# Patient Record
Sex: Male | Born: 1960 | Race: White | Hispanic: No | Marital: Married | Smoking: Never smoker
Health system: Southern US, Community
[De-identification: ages and names within clinical notes are randomized; demographics above are authoritative.]

## PROBLEM LIST (undated history)

## (undated) DIAGNOSIS — M549 Dorsalgia, unspecified: Secondary | ICD-10-CM

## (undated) DIAGNOSIS — M542 Cervicalgia: Secondary | ICD-10-CM

## (undated) DIAGNOSIS — I10 Essential (primary) hypertension: Secondary | ICD-10-CM

## (undated) HISTORY — PX: CHOLECYSTECTOMY: SHX55

## (undated) HISTORY — PX: KNEE SURGERY: SHX244

---

## 2006-07-30 ENCOUNTER — Emergency Department (HOSPITAL_COMMUNITY): Admission: EM | Admit: 2006-07-30 | Discharge: 2006-07-30 | Payer: Self-pay | Admitting: Physician Assistant

## 2006-09-08 ENCOUNTER — Ambulatory Visit (HOSPITAL_COMMUNITY): Admission: RE | Admit: 2006-09-08 | Discharge: 2006-09-08 | Payer: Self-pay | Admitting: Surgery

## 2006-09-08 ENCOUNTER — Encounter (INDEPENDENT_AMBULATORY_CARE_PROVIDER_SITE_OTHER): Payer: Self-pay | Admitting: Surgery

## 2010-09-07 NOTE — Op Note (Signed)
Joel Ray, Joel Ray              ACCOUNT NO.:  [id number]   MEDICAL RECORD NO.:  [id number]          PATIENT TYPE:  AMB   LOCATION:  DAY                          FACILITY:  [facility]   PHYSICIAN:  Ardeth Sportsman, MD     DATE OF BIRTH:  [birth year]   DATE OF PROCEDURE:  09/08/2006  DATE OF DISCHARGE:                               OPERATIVE REPORT   PRIMARY CARE PHYSICIAN:  Holley Bouche, M.D.   SURGEON:  Ardeth Sportsman, M.D.   ASSISTANT:  None.   PREOPERATIVE DIAGNOSIS:  Symptomatic cholecystolithiasis.   POSTOPERATIVE DIAGNOSIS:  Symptomatic cholecystolithiasis.   PROCEDURE:  Laparoscopic cholecystectomy with intraoperative  cholangiogram.   ANESTHESIA:  1. General anesthesia.  2. Local anesthetic in a field block around all port sites.   SPECIMENS:  Gallbladder.   DRAINS:  None.   ESTIMATED BLOOD LOSS:  Less than 5 mL.   COMPLICATIONS:  No major complications.   INDICATIONS FOR PROCEDURE:  Mr. Ocallaghan is a pleasant 50 year old male  who has had classic episodes of biliary colic and known gallstones.  The  discussion was made.  The anatomy and physiology of hepatobiliary and  pancreatic function was discussed.  Pathophysiology of  cholecystolithiasis with its risks of gallstone pancreatitis,  cholecystitis, choledocholithiasis, and other natural histories were  discussed.  Options were discussed and recommendations made for  laparoscopic cholecystectomy with intraoperative cholangiogram.   Risks such as stroke, heart attack, deep vein thrombosis, pulmonary  embolism and death were discussed.  Risks such as bleeding, need for  transfusion, wound infection, abscess, injury to other organs were  discussed.  Risks of bile duct injury resulting in the need for  operative reconstruction or internal and external drainage or incisional  hernia and other risks were discussed.  Questions were answered and he  agreed to proceed.   FINDINGS:  He had a very thin-walled  gallbladder with a little bit of  bile and small cholesterol stones.  There is no strong evidence of  cholecystitis at this time.  His cholangiogram appeared to be normal.  The rest of his anatomy appeared to be normal with only some slight  evidence of chronic cholecystitis.   DESCRIPTION OF PROCEDURE:  Informed consent was confirmed.  The patient  voided just prior to going to the operating room.  He had sequential  compression devices active during the entire case.  He underwent general  anesthesia without difficulty.  He was positioned supine with both arms  tucked.  His abdomen was clipped, prepped and draped in a sterile  fashion.   Entry was begun in the abdomen with the patient in steep reversed  Trendelenburg and right side up using 5 mm 0 degree scope and optical  entry.  Capnoperitoneum to 15 mmHg provided good abdominal insufflation.  Camera inspection revealed no intra-abdominal injury.  Under direct  visualization, 5 mm ports were placed into the umbilicus and right  flank, and a 10 mm port was placed through the subxiphoid tunnel to the  falciform ligament.   The gallbladder fundus was grasped and elevated cephalad.  Omental  adhesions were  freed off carefully.  The peritoneal covering between the  gallbladder and the liver were free on the anteromedial and  posterolateral aspects.  The gallbladder was very thin-walled  anteromedially and there was a small nick made in through it and there  was some spillage of bile, but no significant spillage of stones.  Circumferential dissection was done to free the proximal third of the  gallbladder off the liver bed and circumferential dissection revealed  two structures leaving the gallbladder going down to the porta hepatis.  One was small and pulsative consistent with a cystic artery and the  other was leaving structurally from the tip of the neck of the  infundibulum down to the common bile duct consistent with a cystic  duct.  One clip on the gallbladder side and two clips proximally made on the  cyst artery and it was transected.  Two clips on the infundibulum were  made and a partial cysticotomy was performed.   A 5 French cholangiogram catheter was placed through the abdomen through  a right subcostal fat incision, flushed, and placed in the cystic duct.  Cholangiogram was done using diluted radio-opaque contrast using  continuous fluoroscopy.  Contrast flowed well from a side branch into  the main consistent with cystic duct cannulization.  Contrast flowed  well into the right and left intrahepatic chains to the common bile duct  and down into the duodenum because this was a normal cholangiogram with  no evidence of any ampullary stricture or abnormalities.  Cholangiocatheter was removed.   Cystic duct had four clips placed on it.  This was a long narrow cystic  duct.  The cystic duct transection was completed.  The gallbladder was  freed from its remaining attachments and placed inside an EndoCatch bag  and removed out the subxiphoid port after some mild dilation was needed.  The fascial defect at the subxiphoid area was approximated using a 0  Vicryl stitch using laparoscopic suture passer in a figure-of-eight  fashion to good result.   Copious irrigation about a gallon of crystalloid was done until there  was nice clear result with no evidence of any significant bile.  Hemostasis was excellent on the liver bed and clips were nice intact.  The other upper two abdominal ports were removed with no evidence of  leak of blood on the peritoneal or skin side.  Capnoperitoneum was  completely evacuated.  Skin was closed using 4-0 Monocryl stitch.  Sterile dressings were applied.   The patient was extubated and sent to the recovery room in stable  condition.  I explained the operative findings to the patient's wife.  I discussed the postoperative recovery instructions to the patient,  described  surgery, and with his wife after surgery as well.  Questions  answered and she expressed understanding and appreciation.      Ardeth Sportsman, MD  Electronically Signed     SCG/MEDQ  D:  09/08/2006  T:  09/08/2006  Job:  [id number]   cc:   Holley Bouche, M.D.  Fax: [phone]

## 2012-01-12 ENCOUNTER — Other Ambulatory Visit: Payer: Self-pay | Admitting: Dermatology

## 2012-01-27 ENCOUNTER — Other Ambulatory Visit: Payer: Self-pay | Admitting: Hematology & Oncology

## 2013-07-25 ENCOUNTER — Encounter (HOSPITAL_BASED_OUTPATIENT_CLINIC_OR_DEPARTMENT_OTHER): Payer: Self-pay | Admitting: Emergency Medicine

## 2013-07-25 ENCOUNTER — Emergency Department (HOSPITAL_BASED_OUTPATIENT_CLINIC_OR_DEPARTMENT_OTHER)
Admission: EM | Admit: 2013-07-25 | Discharge: 2013-07-25 | Disposition: A | Discharge status: Home / Self Care | Payer: BC Managed Care – PPO | Attending: Emergency Medicine | Admitting: Emergency Medicine

## 2013-07-25 DIAGNOSIS — M62838 Other muscle spasm: Secondary | ICD-10-CM | POA: Insufficient documentation

## 2013-07-25 DIAGNOSIS — I10 Essential (primary) hypertension: Secondary | ICD-10-CM | POA: Insufficient documentation

## 2013-07-25 HISTORY — DX: Essential (primary) hypertension: I10

## 2013-07-25 HISTORY — DX: Dorsalgia, unspecified: M54.9

## 2013-07-25 HISTORY — DX: Cervicalgia: M54.2

## 2013-07-25 MED ORDER — OXYCODONE-ACETAMINOPHEN 5-325 MG PO TABS
2.0000 | ORAL_TABLET | Freq: Once | ORAL | Status: AC
  Administered 2013-07-25: 2 via ORAL
  Filled 2013-07-25: qty 2

## 2013-07-25 MED ORDER — OXYCODONE-ACETAMINOPHEN 5-325 MG PO TABS: 1.0000 | ORAL_TABLET | Freq: Four times a day (QID) | ORAL | Status: DC | PRN

## 2013-07-25 MED ORDER — DIAZEPAM 5 MG PO TABS
10.0000 mg | ORAL_TABLET | Freq: Once | ORAL | Status: AC
  Administered 2013-07-25: 10 mg via ORAL
  Filled 2013-07-25: qty 2

## 2013-07-25 NOTE — ED Notes (Addendum)
C/o neck pain x 5 days-worse this pm-"feels like a muscle spasm"-took voltaren and flexeril PTA

## 2013-07-25 NOTE — Discharge Instructions (Signed)
Muscle Cramps and Spasms  Muscle cramps and spasms are when muscles tighten by themselves. They usually get better within minutes. Muscle cramps are painful. They are usually stronger and last longer than muscle spasms. Muscle spasms may or may not be painful. They can last a few seconds or much longer.  HOME CARE  · Drink enough fluid to keep your pee (urine) clear or pale yellow.  · Massage, stretch, and relax the muscle.  · Use a warm towel, heating pad, or warm shower water on tight muscles.  · Place ice on the muscle if it is tender or in pain.  · Put ice in a plastic bag.  · Place a towel between your skin and the bag.  · Leave the ice on for 15-20 minutes, 03-04 times a day.  · Only take medicine as told by your doctor.  GET HELP RIGHT AWAY IF:   Your cramps or spasms get worse, happen more often, or do not get better with time.  MAKE SURE YOU:  · Understand these instructions.  · Will watch your condition.  · Will get help right away if you are not doing well or get worse.  Document Released: 03/24/2008 Document Revised: 08/06/2012 Document Reviewed: 03/28/2012  [employer]® Patient Information ©2014 [employer].

## 2013-07-25 NOTE — ED Provider Notes (Signed)
CSN: [id number]     Arrival date & time 07/25/13  2000 History   First MD Initiated Contact with Patient 07/25/13 2032     Chief Complaint  Patient presents with  . Neck Pain     (Consider location/radiation/quality/duration/timing/severity/associated sxs/prior Treatment) HPI Comments: Pt states that he has been sore on the right neck for the last couple of days and then he went to feed the dogs tonight and the pain grabbed him. Pt states that he has a constant are, but then he has a spasm and it is very pain. Denies numbness or weakness. Pt states that he couldn't turn his head until he took flexeril  Patient is a 53 y.o. male presenting with neck pain. The history is provided by the patient. No language interpreter was used.  Neck Pain Pain location:  R side Pain radiates to:  Does not radiate Pain severity:  Severe Onset quality:  Sudden Timing:  Constant Chronicity:  New   Past Medical History  Diagnosis Date  . Back pain   . Neck pain   . Hypertension    Past Surgical History  Procedure Laterality Date  . Cholecystectomy    . Knee surgery     No family history on file. History  Substance Use Topics  . Smoking status: Never Smoker   . Smokeless tobacco: Not on file  . Alcohol Use: Yes    Review of Systems  Constitutional: Negative.   Respiratory: Negative.   Cardiovascular: Negative.   Musculoskeletal: Positive for neck pain.      Allergies  Review of patient's allergies indicates no known allergies.  Home Medications   Current Outpatient Rx  Name  Route  Sig  Dispense  Refill  . AmLODIPine Besylate (NORVASC PO)   Oral   Take by mouth.         . Cyclobenzaprine HCl (FLEXERIL PO)   Oral   Take by mouth.         . Diclofenac Sodium (VOLTAREN PO)   Oral   Take by mouth.         Marland Kitchen HYDRALAZINE-HCTZ PO   Oral   Take by mouth.         Marland Kitchen LOSARTAN POTASSIUM PO   Oral   Take by mouth.         . oxyCODONE-acetaminophen (PERCOCET/ROXICET)  5-325 MG per tablet   Oral   Take 1-2 tablets by mouth every 6 (six) hours as needed for severe pain.   15 tablet   0    BP 150/85  Pulse 65  Temp(Src) 97.8 F (36.6 C) (Oral)  Resp 20  Ht 6\' 4"  (1.93 m)  Wt 250 lb (113.399 kg)  BMI 30.44 kg/m2  SpO2 99% Physical Exam  Nursing note and vitals reviewed. Constitutional: He is oriented to person, place, and time. He appears well-developed and well-nourished.  Cardiovascular: Normal rate and regular rhythm.   Pulmonary/Chest: Effort normal and breath sounds normal.  Musculoskeletal: Normal range of motion.       Cervical back: He exhibits tenderness and spasm. He exhibits no bony tenderness.  Neurological: He is alert and oriented to person, place, and time.  Skin: Skin is warm and dry.    ED Course  Procedures (including critical care time) Labs Review Labs Reviewed - No data to display Imaging Review No results found.   EKG Interpretation None      MDM   Final diagnoses:  Cervical paraspinous muscle spasm    Pt  is neurologically intact:pt has flexeril at home with give oxycodone   Teressa LowerVrinda Nicha Hemann, NP 07/25/13 2115

## 2013-07-25 NOTE — ED Provider Notes (Signed)
Medical screening examination/treatment/procedure(s) were performed by non-physician practitioner and as supervising physician I was immediately available for consultation/collaboration.   EKG Interpretation None        Keeara Frees, MD 07/25/13 2332 

## 2013-07-25 NOTE — ED Notes (Signed)
NP at bedside.

## 2015-09-09 DIAGNOSIS — H5203 Hypermetropia, bilateral: Secondary | ICD-10-CM | POA: Diagnosis not present

## 2015-10-15 DIAGNOSIS — J069 Acute upper respiratory infection, unspecified: Secondary | ICD-10-CM | POA: Diagnosis not present

## 2015-10-15 DIAGNOSIS — B9789 Other viral agents as the cause of diseases classified elsewhere: Secondary | ICD-10-CM | POA: Diagnosis not present

## 2015-11-12 DIAGNOSIS — M5416 Radiculopathy, lumbar region: Secondary | ICD-10-CM | POA: Diagnosis not present

## 2015-11-12 DIAGNOSIS — Z125 Encounter for screening for malignant neoplasm of prostate: Secondary | ICD-10-CM | POA: Diagnosis not present

## 2015-11-12 DIAGNOSIS — E7800 Pure hypercholesterolemia, unspecified: Secondary | ICD-10-CM | POA: Diagnosis not present

## 2015-11-12 DIAGNOSIS — I10 Essential (primary) hypertension: Secondary | ICD-10-CM | POA: Diagnosis not present

## 2016-02-01 DIAGNOSIS — Z23 Encounter for immunization: Secondary | ICD-10-CM | POA: Diagnosis not present

## 2016-02-01 DIAGNOSIS — Z0000 Encounter for general adult medical examination without abnormal findings: Secondary | ICD-10-CM | POA: Diagnosis not present

## 2016-02-16 DIAGNOSIS — L821 Other seborrheic keratosis: Secondary | ICD-10-CM | POA: Diagnosis not present

## 2016-02-16 DIAGNOSIS — D225 Melanocytic nevi of trunk: Secondary | ICD-10-CM | POA: Diagnosis not present

## 2016-02-16 DIAGNOSIS — Z85828 Personal history of other malignant neoplasm of skin: Secondary | ICD-10-CM | POA: Diagnosis not present

## 2016-03-31 DIAGNOSIS — M7712 Lateral epicondylitis, left elbow: Secondary | ICD-10-CM | POA: Diagnosis not present

## 2016-05-16 DIAGNOSIS — L219 Seborrheic dermatitis, unspecified: Secondary | ICD-10-CM | POA: Diagnosis not present

## 2016-05-16 DIAGNOSIS — L821 Other seborrheic keratosis: Secondary | ICD-10-CM | POA: Diagnosis not present

## 2016-05-16 DIAGNOSIS — M7130 Other bursal cyst, unspecified site: Secondary | ICD-10-CM | POA: Diagnosis not present

## 2016-08-01 DIAGNOSIS — M545 Low back pain: Secondary | ICD-10-CM | POA: Diagnosis not present

## 2016-08-01 DIAGNOSIS — I10 Essential (primary) hypertension: Secondary | ICD-10-CM | POA: Diagnosis not present

## 2016-08-01 DIAGNOSIS — G8929 Other chronic pain: Secondary | ICD-10-CM | POA: Diagnosis not present

## 2016-08-01 DIAGNOSIS — E7800 Pure hypercholesterolemia, unspecified: Secondary | ICD-10-CM | POA: Diagnosis not present

## 2016-09-09 DIAGNOSIS — M545 Low back pain: Secondary | ICD-10-CM | POA: Diagnosis not present

## 2017-02-21 DIAGNOSIS — D224 Melanocytic nevi of scalp and neck: Secondary | ICD-10-CM | POA: Diagnosis not present

## 2017-02-21 DIAGNOSIS — D225 Melanocytic nevi of trunk: Secondary | ICD-10-CM | POA: Diagnosis not present

## 2017-02-21 DIAGNOSIS — D485 Neoplasm of uncertain behavior of skin: Secondary | ICD-10-CM | POA: Diagnosis not present

## 2017-02-21 DIAGNOSIS — C44311 Basal cell carcinoma of skin of nose: Secondary | ICD-10-CM | POA: Diagnosis not present

## 2017-02-21 DIAGNOSIS — H019 Unspecified inflammation of eyelid: Secondary | ICD-10-CM | POA: Diagnosis not present

## 2017-02-21 DIAGNOSIS — Z85828 Personal history of other malignant neoplasm of skin: Secondary | ICD-10-CM | POA: Diagnosis not present

## 2017-02-21 DIAGNOSIS — L821 Other seborrheic keratosis: Secondary | ICD-10-CM | POA: Diagnosis not present

## 2017-03-21 DIAGNOSIS — J209 Acute bronchitis, unspecified: Secondary | ICD-10-CM | POA: Diagnosis not present

## 2017-03-29 DIAGNOSIS — L905 Scar conditions and fibrosis of skin: Secondary | ICD-10-CM | POA: Diagnosis not present

## 2017-03-29 DIAGNOSIS — D485 Neoplasm of uncertain behavior of skin: Secondary | ICD-10-CM | POA: Diagnosis not present

## 2017-05-12 DIAGNOSIS — Z125 Encounter for screening for malignant neoplasm of prostate: Secondary | ICD-10-CM | POA: Diagnosis not present

## 2017-05-12 DIAGNOSIS — L219 Seborrheic dermatitis, unspecified: Secondary | ICD-10-CM | POA: Diagnosis not present

## 2017-05-12 DIAGNOSIS — M545 Low back pain: Secondary | ICD-10-CM | POA: Diagnosis not present

## 2017-05-12 DIAGNOSIS — E7800 Pure hypercholesterolemia, unspecified: Secondary | ICD-10-CM | POA: Diagnosis not present

## 2017-05-12 DIAGNOSIS — I10 Essential (primary) hypertension: Secondary | ICD-10-CM | POA: Diagnosis not present

## 2017-05-30 DIAGNOSIS — C44311 Basal cell carcinoma of skin of nose: Secondary | ICD-10-CM | POA: Diagnosis not present

## 2018-01-10 DIAGNOSIS — M5416 Radiculopathy, lumbar region: Secondary | ICD-10-CM | POA: Diagnosis not present

## 2018-01-10 DIAGNOSIS — E7800 Pure hypercholesterolemia, unspecified: Secondary | ICD-10-CM | POA: Diagnosis not present

## 2018-01-10 DIAGNOSIS — I10 Essential (primary) hypertension: Secondary | ICD-10-CM | POA: Diagnosis not present

## 2018-01-15 ENCOUNTER — Other Ambulatory Visit: Payer: Self-pay | Admitting: Family Medicine

## 2018-01-15 DIAGNOSIS — M79605 Pain in left leg: Secondary | ICD-10-CM

## 2018-01-15 DIAGNOSIS — R29898 Other symptoms and signs involving the musculoskeletal system: Secondary | ICD-10-CM

## 2018-01-15 DIAGNOSIS — M549 Dorsalgia, unspecified: Secondary | ICD-10-CM

## 2018-01-15 DIAGNOSIS — G8929 Other chronic pain: Secondary | ICD-10-CM

## 2018-01-15 DIAGNOSIS — M5416 Radiculopathy, lumbar region: Secondary | ICD-10-CM

## 2018-01-24 ENCOUNTER — Ambulatory Visit
Admission: RE | Admit: 2018-01-24 | Discharge: 2018-01-24 | Disposition: A | Discharge status: Home / Self Care | Payer: BLUE CROSS/BLUE SHIELD | Source: Ambulatory Visit | Attending: Family Medicine | Admitting: Family Medicine

## 2018-01-24 DIAGNOSIS — G8929 Other chronic pain: Secondary | ICD-10-CM

## 2018-01-24 DIAGNOSIS — M549 Dorsalgia, unspecified: Secondary | ICD-10-CM

## 2018-01-24 DIAGNOSIS — R29898 Other symptoms and signs involving the musculoskeletal system: Secondary | ICD-10-CM

## 2018-01-24 DIAGNOSIS — M79605 Pain in left leg: Secondary | ICD-10-CM

## 2018-01-24 DIAGNOSIS — M5416 Radiculopathy, lumbar region: Secondary | ICD-10-CM

## 2018-01-24 DIAGNOSIS — M48061 Spinal stenosis, lumbar region without neurogenic claudication: Secondary | ICD-10-CM | POA: Diagnosis not present

## 2018-02-05 DIAGNOSIS — Z6828 Body mass index (BMI) 28.0-28.9, adult: Secondary | ICD-10-CM | POA: Diagnosis not present

## 2018-02-05 DIAGNOSIS — M545 Low back pain: Secondary | ICD-10-CM | POA: Diagnosis not present

## 2018-03-08 DIAGNOSIS — D224 Melanocytic nevi of scalp and neck: Secondary | ICD-10-CM | POA: Diagnosis not present

## 2018-03-08 DIAGNOSIS — Z85828 Personal history of other malignant neoplasm of skin: Secondary | ICD-10-CM | POA: Diagnosis not present

## 2018-03-08 DIAGNOSIS — D225 Melanocytic nevi of trunk: Secondary | ICD-10-CM | POA: Diagnosis not present

## 2018-03-08 DIAGNOSIS — C44612 Basal cell carcinoma of skin of right upper limb, including shoulder: Secondary | ICD-10-CM | POA: Diagnosis not present

## 2018-03-08 DIAGNOSIS — H01119 Allergic dermatitis of unspecified eye, unspecified eyelid: Secondary | ICD-10-CM | POA: Diagnosis not present

## 2018-03-08 DIAGNOSIS — D485 Neoplasm of uncertain behavior of skin: Secondary | ICD-10-CM | POA: Diagnosis not present

## 2018-04-05 DIAGNOSIS — C44612 Basal cell carcinoma of skin of right upper limb, including shoulder: Secondary | ICD-10-CM | POA: Diagnosis not present

## 2018-04-11 DIAGNOSIS — M545 Low back pain: Secondary | ICD-10-CM | POA: Diagnosis not present

## 2018-05-14 DIAGNOSIS — M545 Low back pain: Secondary | ICD-10-CM | POA: Diagnosis not present

## 2018-06-19 DIAGNOSIS — M545 Low back pain: Secondary | ICD-10-CM | POA: Diagnosis not present

## 2018-07-05 DIAGNOSIS — R109 Unspecified abdominal pain: Secondary | ICD-10-CM | POA: Diagnosis not present

## 2018-07-05 DIAGNOSIS — I10 Essential (primary) hypertension: Secondary | ICD-10-CM | POA: Diagnosis not present

## 2018-07-05 DIAGNOSIS — Z125 Encounter for screening for malignant neoplasm of prostate: Secondary | ICD-10-CM | POA: Diagnosis not present

## 2018-07-05 DIAGNOSIS — E7800 Pure hypercholesterolemia, unspecified: Secondary | ICD-10-CM | POA: Diagnosis not present

## 2018-07-05 DIAGNOSIS — M545 Low back pain: Secondary | ICD-10-CM | POA: Diagnosis not present

## 2018-08-24 HISTORY — PX: BACK SURGERY: SHX140

## 2018-08-29 DIAGNOSIS — M48062 Spinal stenosis, lumbar region with neurogenic claudication: Secondary | ICD-10-CM | POA: Diagnosis not present

## 2018-08-29 DIAGNOSIS — M47816 Spondylosis without myelopathy or radiculopathy, lumbar region: Secondary | ICD-10-CM | POA: Diagnosis not present

## 2018-12-14 ENCOUNTER — Encounter (HOSPITAL_COMMUNITY): Payer: Self-pay | Admitting: Emergency Medicine

## 2018-12-14 ENCOUNTER — Other Ambulatory Visit: Payer: Self-pay

## 2018-12-14 ENCOUNTER — Emergency Department (HOSPITAL_COMMUNITY)
Admission: EM | Admit: 2018-12-14 | Discharge: 2018-12-14 | Disposition: A | Discharge status: Home / Self Care | Payer: BC Managed Care – PPO | Attending: Emergency Medicine | Admitting: Emergency Medicine

## 2018-12-14 DIAGNOSIS — Z79899 Other long term (current) drug therapy: Secondary | ICD-10-CM | POA: Insufficient documentation

## 2018-12-14 DIAGNOSIS — R55 Syncope and collapse: Secondary | ICD-10-CM | POA: Diagnosis not present

## 2018-12-14 DIAGNOSIS — I10 Essential (primary) hypertension: Secondary | ICD-10-CM | POA: Insufficient documentation

## 2018-12-14 DIAGNOSIS — R001 Bradycardia, unspecified: Secondary | ICD-10-CM

## 2018-12-14 DIAGNOSIS — R42 Dizziness and giddiness: Secondary | ICD-10-CM | POA: Diagnosis not present

## 2018-12-14 DIAGNOSIS — R569 Unspecified convulsions: Secondary | ICD-10-CM | POA: Diagnosis not present

## 2018-12-14 DIAGNOSIS — R51 Headache: Secondary | ICD-10-CM | POA: Diagnosis not present

## 2018-12-14 LAB — CBC
HCT: 47.9 % (ref 39.0–52.0)
Hemoglobin: 15.7 g/dL (ref 13.0–17.0)
MCH: 30.5 pg (ref 26.0–34.0)
MCHC: 32.8 g/dL (ref 30.0–36.0)
MCV: 93 fL (ref 80.0–100.0)
Platelets: 217 10*3/uL (ref 150–400)
RBC: 5.15 MIL/uL (ref 4.22–5.81)
RDW: 13.1 % (ref 11.5–15.5)
WBC: 6.4 10*3/uL (ref 4.0–10.5)
nRBC: 0 % (ref 0.0–0.2)

## 2018-12-14 LAB — URINALYSIS, ROUTINE W REFLEX MICROSCOPIC
Bilirubin Urine: NEGATIVE
Glucose, UA: NEGATIVE mg/dL
Hgb urine dipstick: NEGATIVE
Ketones, ur: NEGATIVE mg/dL
Leukocytes,Ua: NEGATIVE
Nitrite: NEGATIVE
Protein, ur: NEGATIVE mg/dL
Specific Gravity, Urine: 1.019 (ref 1.005–1.030)
pH: 5 (ref 5.0–8.0)

## 2018-12-14 LAB — BASIC METABOLIC PANEL
Anion gap: 9 (ref 5–15)
BUN: 12 mg/dL (ref 6–20)
CO2: 25 mmol/L (ref 22–32)
Calcium: 8.8 mg/dL — ABNORMAL LOW (ref 8.9–10.3)
Chloride: 105 mmol/L (ref 98–111)
Creatinine, Ser: 1.05 mg/dL (ref 0.61–1.24)
GFR calc Af Amer: >60 mL/min (ref >60)
GFR calc non Af Amer: >60 mL/min (ref >60)
Glucose, Bld: 120 mg/dL — ABNORMAL HIGH (ref 70–99)
Potassium: 4 mmol/L (ref 3.5–5.1)
Sodium: 139 mmol/L (ref 135–145)

## 2018-12-14 LAB — TROPONIN I (HIGH SENSITIVITY): Troponin I (High Sensitivity): 5 ng/L (ref <18)

## 2018-12-14 LAB — CBG MONITORING, ED: Glucose-Capillary: 109 mg/dL — ABNORMAL HIGH (ref 70–99)

## 2018-12-14 MED ORDER — SODIUM CHLORIDE 0.9% FLUSH: 3.0000 mL | Freq: Once | INTRAVENOUS | Status: DC

## 2018-12-14 NOTE — ED Provider Notes (Signed)
[facility] EMERGENCY DEPARTMENT Provider Note   CSN: [id number] Arrival date & time: 12/14/18  9381     History   Chief Complaint Chief Complaint  Patient presents with  . Loss of Consciousness    HPI Joel Ray is a 58 y.o. male.     Patient with hx htn, c/o syncopal episode at home this AM. Patient indicates felt normal last night when went to bed. This AM, when awoke, realized he had overslept several minutes, was concerned he would be late so jumped up out of bed quickly, when doing so, got a cramp in leg, and also felt urge to use bathroom. Went to bathroom, and when on toilet, urinating, had syncopal episode slumping forward from toilet. No gen seizure activity, no postictal period. Pt denies any associated sense of palpitations or irregular heartbeat. Did not have his apple watch on at the time. Patient currently feels fine, no faintness or dizziness. Denies any associated cp or discomfort. No sob or unusual doe or fatigue. Denies any severe headaches. No neck or back pain (hx back surgery in May). Denies any recent change in meds or new meds. Denies any recent blood loss, rectal bleeding or melena. No fever or chills.  Patients hr at baseline is in 40 range, his watch does show that hr as low as 44 on prior days, and hr on monitor in ED as low as 46, sinus.   The history is provided by the patient, the spouse and the EMS personnel.  Loss of Consciousness Associated symptoms: no chest pain, no confusion, no fever, no headaches, no palpitations, no shortness of breath, no vomiting and no weakness     Past Medical History:  Diagnosis Date  . Back pain   . Hypertension   . Neck pain     There are no active problems to display for this patient.   Past Surgical History:  Procedure Laterality Date  . BACK SURGERY  08/2018  . CHOLECYSTECTOMY    . KNEE SURGERY          Home Medications    Prior to Admission medications   Medication Sig Start  Date End Date Taking? Authorizing Provider  AmLODIPine Besylate (NORVASC PO) Take by mouth.    [provider]  Cyclobenzaprine HCl (FLEXERIL PO) Take by mouth.    [provider]  Diclofenac Sodium (VOLTAREN PO) Take by mouth.    [provider]  HYDRALAZINE-HCTZ PO Take by mouth.    [provider]  LOSARTAN POTASSIUM PO Take by mouth.    [provider]  oxyCODONE-acetaminophen (PERCOCET/ROXICET) 5-325 MG per tablet Take 1-2 tablets by mouth every 6 (six) hours as needed for severe pain. 07/25/13   Glendell Docker, NP    Family History No family history on file.  Social History Social History   Tobacco Use  . Smoking status: Never Smoker  Substance Use Topics  . Alcohol use: Yes  . Drug use: No     Allergies   Patient has no known allergies.   Review of Systems Review of Systems  Constitutional: Negative for fever.  HENT: Negative for sore throat.   Eyes: Negative for redness.  Respiratory: Negative for cough and shortness of breath.   Cardiovascular: Positive for syncope. Negative for chest pain, palpitations and leg swelling.  Gastrointestinal: Negative for abdominal pain, blood in stool, diarrhea and vomiting.  Genitourinary: Negative for dysuria and flank pain.  Musculoskeletal: Negative for back pain and neck pain.  Skin: Negative for rash.  Neurological: Positive for syncope. Negative for weakness, numbness and headaches.  Hematological: Does not bruise/bleed easily.  Psychiatric/Behavioral: Negative for confusion.     Physical Exam Updated Vital Signs BP 140/84   Pulse (!) 55   Temp 97.7 F (36.5 C) (Oral)   Resp 17   Ht 1.93 m (6\' 4" )   SpO2 99%   BMI 30.43 kg/m   Physical Exam Vitals signs and nursing note reviewed.  Constitutional:      Appearance: Normal appearance. He is well-developed.  HENT:     Head: Atraumatic.     Nose: Nose normal.     Mouth/Throat:     Mouth: Mucous membranes are moist.      Pharynx: Oropharynx is clear.  Eyes:     General: No scleral icterus.    Conjunctiva/sclera: Conjunctivae normal.     Pupils: Pupils are equal, round, and reactive to light.  Neck:     Musculoskeletal: Normal range of motion and neck supple. No neck rigidity.     Vascular: No carotid bruit.     Trachea: No tracheal deviation.  Cardiovascular:     Rate and Rhythm: Regular rhythm. Bradycardia present.     Pulses: Normal pulses.     Heart sounds: Normal heart sounds. No murmur. No friction rub. No gallop.   Pulmonary:     Effort: Pulmonary effort is normal. No accessory muscle usage or respiratory distress.     Breath sounds: Normal breath sounds.  Abdominal:     General: Bowel sounds are normal. There is no distension.     Palpations: Abdomen is soft.     Tenderness: There is no abdominal tenderness. There is no guarding.  Genitourinary:    Comments: No cva tenderness. Musculoskeletal:        General: No swelling.  Skin:    General: Skin is warm and dry.     Findings: No rash.  Neurological:     Mental Status: He is alert.     Comments: Alert, speech clear/fluent. Motor/sens grossly intact bil. Steady gait.   Psychiatric:        Mood and Affect: Mood normal.      ED Treatments / Results  Labs (all labs ordered are listed, but only abnormal results are displayed) Results for orders placed or performed during the hospital encounter of 12/14/18  Basic metabolic panel  Result Value Ref Range   Sodium 139 135 - 145 mmol/L   Potassium 4.0 3.5 - 5.1 mmol/L   Chloride 105 98 - 111 mmol/L   CO2 25 22 - 32 mmol/L   Glucose, Bld 120 (H) 70 - 99 mg/dL   BUN 12 6 - 20 mg/dL   Creatinine, Ser 1.611.05 0.61 - 1.24 mg/dL   Calcium 8.8 (L) 8.9 - 10.3 mg/dL   GFR calc non Af Amer >60 >60 mL/min   GFR calc Af Amer >60 >60 mL/min   Anion gap 9 5 - 15  CBC  Result Value Ref Range   WBC 6.4 4.0 - 10.5 K/uL   RBC 5.15 4.22 - 5.81 MIL/uL   Hemoglobin 15.7 13.0 - 17.0 g/dL   HCT 09.647.9  04.539.0 - 40.952.0 %   MCV 93.0 80.0 - 100.0 fL   MCH 30.5 26.0 - 34.0 pg   MCHC 32.8 30.0 - 36.0 g/dL   RDW 81.113.1 91.411.5 - 78.215.5 %   Platelets 217 150 - 400 K/uL   nRBC 0.0 0.0 - 0.2 %  CBG  monitoring, ED  Result Value Ref Range   Glucose-Capillary 109 (H) 70 - 99 mg/dL  Troponin I (High Sensitivity)  Result Value Ref Range   Troponin I (High Sensitivity) 5 <18 ng/L    EKG EKG Interpretation  Date/Time:  Friday December 14 2018 07:40:39 EDT Ventricular Rate:  52 PR Interval:    QRS Duration: 100 QT Interval:  410 QTC Calculation: 382 R Axis:   29 Text Interpretation:  Sinus bradycardia No significant change since last tracing Confirmed by Cathren LaineSteinl, Altamese Deguire ([id number]) on 12/14/2018 8:21:48 AM   Radiology No results found.  Procedures Procedures (including critical care time)  Medications Ordered in ED Medications  sodium chloride flush (NS) 0.9 % injection 3 mL (3 mLs Intravenous Not Given 12/14/18 0800)     Initial Impression / Assessment and Plan / ED Course  I have reviewed the triage vital signs and the nursing notes.  Pertinent labs & imaging results that were available during my care of the patient were reviewed by me and considered in my medical decision making (see chart for details).  Iv ns. Continuous pulse ox and monitor. Stat labs. Ecg.   Reviewed nursing notes and prior charts for additional history.   Patient remains asymptomatic in ED. Suspect probable vagal episode in setting of pre-existing sinus bradycardia, resulting in syncopal event. Prior ecg from several years prior with similar heart rate.   Also discussed diff dx including SSS, and need for outpt f/u, possible holter/event monitor, possible cardiology eval - pt indicates will f/u with pcp.   Labs reviewed by me - chem normal. hgb normal.   Pt remains in sinus rhythm, other than sinus brady hr mid to upper 40s or low 50s, no other dysrhythmia noted. No current or recent cp or discomfort of any sort. No unusual  doe or fatigue.  Pt appears stable for d/c.     Final Clinical Impressions(s) / ED Diagnoses   Final diagnoses:  None    ED Discharge Orders    None       Cathren LaineSteinl, Cole Eastridge, MD 12/14/18 [phone]

## 2018-12-14 NOTE — Discharge Instructions (Signed)
It was our pleasure to provide your ER care today - we hope that you feel better.  Your heart rate is slow, as low as mid 40's at times. When standing from lying position, try sitting on edge of chair or bed for a few moments, to decrease chance of feeling faint or fainting. Also, follow up with your doctor in the next 1-2 weeks - discuss possible home heart monitoring. Also discuss possible referral to cardiology if slow heart rate becomes more pronounced, or if recurrent episodes of feeling faint.   If you begin to feel faint, lie down right away to avoid fainting and possible injury.   Return to ER right away if worse, new symptoms, persistent fast heart beat, chest pain, trouble breathing, recurrent fainting episode, or other concern.

## 2018-12-14 NOTE — ED Triage Notes (Addendum)
Pt Bib by EMS for syncopal episode at home. Pt was using the bathroom and fell forward, last he remembers was sitting on the toilet and then he was on the floor with wife standing over him. Wife noted twitching in his face, no history of seizures. No head, neck, or back pain noted. Pt notes R wrist pain. Upon EMS arrival, pt's HR was in the 40s, pt states his normal HR is 50-60s, otherwise VSS. Pt states he has had intermittent Has for 2 weeks and had increased UOP yesterday.

## 2019-01-03 NOTE — Progress Notes (Signed)
Cardiology Office Note:   Date:  01/04/2019  NAME:  Joel Scrapeerry L Mino    MRN: [id number] DOB:  Jan 31, 1961   PCP:  Johny BlamerHarris, William, MD  Cardiologist:  Reatha HarpsWesley T O'Neal, MD  Electrophysiologist:  None   Referring MD: Johny BlamerHarris, William, MD   Chief Complaint  Patient presents with   Loss of Consciousness   History of Present Illness:   Joel Ray is a 58 y.o. male with a hx of hypertension who is being seen today for the evaluation of syncope at the request of Johny BlamerHarris, William, MD.  He presents for evaluation of syncope.  He was seen in the emergency room about 3 weeks ago after a syncopal event.  The event happened in the morning, upon awakening.  He reports he got out of bed and was running late.  He did not eat or drink anything that morning.  He reports he went to the bathroom.  And after using the bathroom he reports that he blacked out.  He reports no prodrome of chest pain, shortness of breath, palpitations prior to the event.  He reports his wife found him on the floor.  His wife presents with him today, and corroborates his story.  She states that he was slow to return to his normal self.  She states it took about 3 to 5 minutes before he was back to normal.  She reports that there was rapid eye movement, and concern for seizure-like activity.  However, after the event he was extremely back to normal.  He was evaluated in the emergency room, and ruled out for an acute coronary syndrome.  He was noted to have slow heart rate on telemetry in the 40-50 range, and was instructed to follow with cardiology.  The event was attributed to vasovagal syncope.  He reports he has returned to his normal self, and exercises daily.  He reports he is able to walk up to 2 miles without any limitations.  He reports this episode of syncope was a one-time isolated event, and he has never had this before.  He does report a family history of heart attack in his father.  He has a history of high blood pressure and  high cholesterol.  Orthostatic vitals are negative here today.  He does not smoke or use illicit drugs.  He is relatively healthy otherwise.  Review of laboratory data shows elevated cholesterol which she will talk with his primary care physician about.  Past Medical History: Past Medical History:  Diagnosis Date   Back pain    Hypertension    Neck pain     Past Surgical History: Past Surgical History:  Procedure Laterality Date   BACK SURGERY  08/2018   CHOLECYSTECTOMY     KNEE SURGERY      Current Medications: Current Meds  Medication Sig   hydrochlorothiazide (HYDRODIURIL) 25 MG tablet Take 25 mg by mouth daily.   olmesartan (BENICAR) 40 MG tablet Take 40 mg by mouth daily.     Allergies:    Patient has no known allergies.   Social History: Social History   Socioeconomic History   Marital status: Married    Spouse name: Not on file   Number of children: Not on file   Years of education: Not on file   Highest education level: Not on file  Occupational History   Not on file  Social Needs   Financial resource strain: Not on file   Food insecurity    Worry: Not  on file    Inability: Not on file   Transportation needs    Medical: Not on file    Non-medical: Not on file  Tobacco Use   Smoking status: Never Smoker   Smokeless tobacco: Never Used  Substance and Sexual Activity   Alcohol use: Yes   Drug use: No   Sexual activity: Not on file  Lifestyle   Physical activity    Days per week: Not on file    Minutes per session: Not on file   Stress: Not on file  Relationships   Social connections    Talks on phone: Not on file    Gets together: Not on file    Attends religious service: Not on file    Active member of club or organization: Not on file    Attends meetings of clubs or organizations: Not on file    Relationship status: Not on file  Other Topics Concern   Not on file  Social History Narrative   Not on file      Family History: The patient's family history includes Heart attack in his father; Lupus in his mother.  ROS:   All other ROS reviewed and negative. Pertinent positives noted in the HPI.     EKGs/Labs/Other Studies Reviewed:   The following studies were personally reviewed by me today:  EKG:  EKG is ordered today.  The ekg ordered today demonstrates normal sinus rhythm, heart rate 61, normal intervals, normal axis, no ST depression to suggest ischemia, no prior infarction, and was personally reviewed by me.   Recent Labs: 12/14/2018: BUN 12; Creatinine, Ser 1.05; Hemoglobin 15.7; Platelets 217; Potassium 4.0; Sodium 139   Recent Lipid Panel No results found for: CHOL, TRIG, HDL, CHOLHDL, VLDL, LDLCALC, LDLDIRECT  Physical Exam:   VS:  BP (!) 162/97    Pulse 61    Ht 6\' 4"  (1.93 m)    Wt 256 lb 6.4 oz (116.3 kg)    BMI 31.21 kg/m    Wt Readings from Last 3 Encounters:  01/04/19 256 lb 6.4 oz (116.3 kg)  07/25/13 250 lb (113.4 kg)    General: Well nourished, well developed, in no acute distress Heart: Atraumatic, normal size  Eyes: PEERLA, EOMI  Neck: Supple, no JVD Endocrine: No thryomegaly Cardiac: Normal S1, S2; RRR; no murmurs, rubs, or gallops Lungs: Clear to auscultation bilaterally, no wheezing, rhonchi or rales  Abd: Soft, nontender, no hepatomegaly  Ext: No edema, pulses 2+ Musculoskeletal: No deformities, BUE and BLE strength normal and equal Skin: Warm and dry, no rashes   Neuro: Alert and oriented to person, place, time, and situation, CNII-XII grossly intact, no focal deficits  Psych: Normal mood and affect   ASSESSMENT:   NAME@ is a 58 y.o. male who presents for the following: 1. Vasovagal syncope   2. Essential hypertension   3. Mixed hyperlipidemia     PLAN:   1. Vasovagal syncope -He presents after a benign episode of vasovagal syncope.  It likely was attributed due to micturition.  His evaluation emergency room was negative, and I reviewed that data  today.  His cardiac examination is extremely benign, and he has no murmurs to suggest valvular heart disease, and has no evidence of heart failure on examination.  His EKG also is very normal with heart rate in the 60s and no evidence of high-grade AV block.  I discussed with him that this was a one-time isolated event, and likely of no malignant nature.  He has been monitoring his heart rate and has noticed heart rate in the 40s while sleeping, which I instructed him was normal.  Since he is able to maintain a high level of exercise capacity, up to 2 miles daily, he needs no further evaluation and I can tell.  I discussed with him that he will need to get better control of blood pressure, lose weight and work with his primary care physician on his cholesterol to further reduce his risk of having any cardiovascular disease in the future.  He reports that he will do this.  Should he need to see [place] back, I would like to discuss further preventive measures, I told him I would be happy to see him.  But at this time, there is no need for scheduled follow-up.  2. Essential hypertension -Continue current medications  3. Mixed hyperlipidemia -Advised him to discuss lifestyle modifications with his primary care physician, we will be happy to see him back if he would like to pursue calcium scoring or more aggressive lipid control   Disposition: Return if symptoms worsen or fail to improve.  Medication Adjustments/Labs and Tests Ordered: Current medicines are reviewed at length with the patient today.  Concerns regarding medicines are outlined above.  No orders of the defined types were placed in this encounter.  No orders of the defined types were placed in this encounter.   Patient Instructions  Medication Instructions:  Continue current medications  If you need a refill on your cardiac medications before your next appointment, please call your pharmacy.  Labwork: None Ordered    Testing/Procedures: None Ordered  Follow-Up:  Your physician recommends that you schedule a follow-up appointment in: As Needed   At [facility], you and your health needs are our priority.  As part of our continuing mission to provide you with exceptional heart care, we have created designated Provider Care Teams.  These Care Teams include your primary Cardiologist (physician) and Advanced Practice Providers (APPs -  Physician Assistants and Nurse Practitioners) who all work together to provide you with the care you need, when you need it.  Thank you for choosing [facility] [employer] T. Flora Lipps, MD [facility]  [address], [address] [place], [place] [address] [phone]  01/04/2019 12:07 PM

## 2019-01-04 ENCOUNTER — Ambulatory Visit (INDEPENDENT_AMBULATORY_CARE_PROVIDER_SITE_OTHER): Payer: BC Managed Care – PPO | Admitting: Cardiovascular Disease

## 2019-01-04 ENCOUNTER — Encounter: Payer: Self-pay | Admitting: Cardiovascular Disease

## 2019-01-04 ENCOUNTER — Other Ambulatory Visit: Payer: Self-pay

## 2019-01-04 VITALS — BP 162/97 | HR 61 | Ht 76.0 in | Wt 256.4 lb

## 2019-01-04 DIAGNOSIS — E782 Mixed hyperlipidemia: Secondary | ICD-10-CM

## 2019-01-04 DIAGNOSIS — I10 Essential (primary) hypertension: Secondary | ICD-10-CM | POA: Diagnosis not present

## 2019-01-04 DIAGNOSIS — R55 Syncope and collapse: Secondary | ICD-10-CM

## 2019-01-04 NOTE — Patient Instructions (Signed)

## 2019-02-21 DIAGNOSIS — Z0000 Encounter for general adult medical examination without abnormal findings: Secondary | ICD-10-CM | POA: Diagnosis not present

## 2019-03-08 DIAGNOSIS — Z85828 Personal history of other malignant neoplasm of skin: Secondary | ICD-10-CM | POA: Diagnosis not present

## 2019-03-08 DIAGNOSIS — D225 Melanocytic nevi of trunk: Secondary | ICD-10-CM | POA: Diagnosis not present

## 2019-03-08 DIAGNOSIS — L570 Actinic keratosis: Secondary | ICD-10-CM | POA: Diagnosis not present

## 2019-03-08 DIAGNOSIS — D2330 Other benign neoplasm of skin of unspecified part of face: Secondary | ICD-10-CM | POA: Diagnosis not present

## 2019-03-08 DIAGNOSIS — Z86018 Personal history of other benign neoplasm: Secondary | ICD-10-CM | POA: Diagnosis not present

## 2019-05-23 DIAGNOSIS — Z1159 Encounter for screening for other viral diseases: Secondary | ICD-10-CM | POA: Diagnosis not present

## 2019-05-28 DIAGNOSIS — K5730 Diverticulosis of large intestine without perforation or abscess without bleeding: Secondary | ICD-10-CM | POA: Diagnosis not present

## 2019-05-28 DIAGNOSIS — Z86010 Personal history of colonic polyps: Secondary | ICD-10-CM | POA: Diagnosis not present

## 2019-06-12 DIAGNOSIS — J029 Acute pharyngitis, unspecified: Secondary | ICD-10-CM | POA: Diagnosis not present

## 2019-06-12 DIAGNOSIS — R432 Parageusia: Secondary | ICD-10-CM | POA: Diagnosis not present

## 2019-06-12 DIAGNOSIS — U071 COVID-19: Secondary | ICD-10-CM | POA: Diagnosis not present

## 2019-12-02 ENCOUNTER — Other Ambulatory Visit: Payer: Self-pay

## 2019-12-02 ENCOUNTER — Other Ambulatory Visit: Payer: Self-pay | Admitting: Family Medicine

## 2019-12-02 ENCOUNTER — Ambulatory Visit
Admission: RE | Admit: 2019-12-02 | Discharge: 2019-12-02 | Disposition: A | Discharge status: Home / Self Care | Payer: BC Managed Care – PPO | Source: Ambulatory Visit | Attending: Family Medicine | Admitting: Family Medicine

## 2019-12-02 DIAGNOSIS — R05 Cough: Secondary | ICD-10-CM | POA: Diagnosis not present

## 2019-12-02 DIAGNOSIS — Z125 Encounter for screening for malignant neoplasm of prostate: Secondary | ICD-10-CM | POA: Diagnosis not present

## 2019-12-02 DIAGNOSIS — E7800 Pure hypercholesterolemia, unspecified: Secondary | ICD-10-CM | POA: Diagnosis not present

## 2019-12-02 DIAGNOSIS — I10 Essential (primary) hypertension: Secondary | ICD-10-CM | POA: Diagnosis not present

## 2019-12-02 DIAGNOSIS — R059 Cough, unspecified: Secondary | ICD-10-CM

## 2019-12-02 DIAGNOSIS — M545 Low back pain: Secondary | ICD-10-CM | POA: Diagnosis not present

## 2020-06-03 DIAGNOSIS — I10 Essential (primary) hypertension: Secondary | ICD-10-CM | POA: Diagnosis not present

## 2020-06-03 DIAGNOSIS — E7800 Pure hypercholesterolemia, unspecified: Secondary | ICD-10-CM | POA: Diagnosis not present

## 2020-06-03 DIAGNOSIS — M545 Low back pain, unspecified: Secondary | ICD-10-CM | POA: Diagnosis not present

## 2020-07-08 DIAGNOSIS — D485 Neoplasm of uncertain behavior of skin: Secondary | ICD-10-CM | POA: Diagnosis not present

## 2020-07-08 DIAGNOSIS — L570 Actinic keratosis: Secondary | ICD-10-CM | POA: Diagnosis not present

## 2020-07-08 DIAGNOSIS — D225 Melanocytic nevi of trunk: Secondary | ICD-10-CM | POA: Diagnosis not present

## 2020-07-08 DIAGNOSIS — L308 Other specified dermatitis: Secondary | ICD-10-CM | POA: Diagnosis not present

## 2020-08-10 DIAGNOSIS — J0100 Acute maxillary sinusitis, unspecified: Secondary | ICD-10-CM | POA: Diagnosis not present

## 2020-09-16 DIAGNOSIS — D485 Neoplasm of uncertain behavior of skin: Secondary | ICD-10-CM | POA: Diagnosis not present

## 2020-09-16 DIAGNOSIS — L988 Other specified disorders of the skin and subcutaneous tissue: Secondary | ICD-10-CM | POA: Diagnosis not present

## 2020-12-02 ENCOUNTER — Other Ambulatory Visit: Payer: Self-pay | Admitting: Dermatology

## 2020-12-02 DIAGNOSIS — N6310 Unspecified lump in the right breast, unspecified quadrant: Secondary | ICD-10-CM

## 2020-12-02 DIAGNOSIS — L570 Actinic keratosis: Secondary | ICD-10-CM | POA: Diagnosis not present

## 2020-12-02 DIAGNOSIS — D485 Neoplasm of uncertain behavior of skin: Secondary | ICD-10-CM | POA: Diagnosis not present

## 2020-12-02 DIAGNOSIS — D224 Melanocytic nevi of scalp and neck: Secondary | ICD-10-CM | POA: Diagnosis not present

## 2020-12-02 DIAGNOSIS — L578 Other skin changes due to chronic exposure to nonionizing radiation: Secondary | ICD-10-CM | POA: Diagnosis not present

## 2021-01-06 ENCOUNTER — Ambulatory Visit
Admission: RE | Admit: 2021-01-06 | Discharge: 2021-01-06 | Disposition: A | Discharge status: Home / Self Care | Payer: BC Managed Care – PPO | Source: Ambulatory Visit | Attending: Dermatology | Admitting: Dermatology

## 2021-01-06 ENCOUNTER — Other Ambulatory Visit: Payer: Self-pay

## 2021-01-06 DIAGNOSIS — G8929 Other chronic pain: Secondary | ICD-10-CM | POA: Diagnosis not present

## 2021-01-06 DIAGNOSIS — N6310 Unspecified lump in the right breast, unspecified quadrant: Secondary | ICD-10-CM

## 2021-01-06 DIAGNOSIS — I10 Essential (primary) hypertension: Secondary | ICD-10-CM | POA: Diagnosis not present

## 2021-01-06 DIAGNOSIS — E7800 Pure hypercholesterolemia, unspecified: Secondary | ICD-10-CM | POA: Diagnosis not present

## 2021-01-06 DIAGNOSIS — N6311 Unspecified lump in the right breast, upper outer quadrant: Secondary | ICD-10-CM | POA: Diagnosis not present

## 2021-03-04 DIAGNOSIS — D485 Neoplasm of uncertain behavior of skin: Secondary | ICD-10-CM | POA: Diagnosis not present

## 2021-03-04 DIAGNOSIS — D225 Melanocytic nevi of trunk: Secondary | ICD-10-CM | POA: Diagnosis not present

## 2021-06-29 DIAGNOSIS — M5416 Radiculopathy, lumbar region: Secondary | ICD-10-CM | POA: Diagnosis not present

## 2021-06-29 DIAGNOSIS — I10 Essential (primary) hypertension: Secondary | ICD-10-CM | POA: Diagnosis not present

## 2021-06-29 DIAGNOSIS — Z6832 Body mass index (BMI) 32.0-32.9, adult: Secondary | ICD-10-CM | POA: Diagnosis not present

## 2021-06-30 ENCOUNTER — Other Ambulatory Visit: Payer: Self-pay | Admitting: Student

## 2021-06-30 DIAGNOSIS — M5416 Radiculopathy, lumbar region: Secondary | ICD-10-CM

## 2021-07-02 ENCOUNTER — Ambulatory Visit
Admission: RE | Admit: 2021-07-02 | Discharge: 2021-07-02 | Disposition: A | Discharge status: Home / Self Care | Payer: BC Managed Care – PPO | Source: Ambulatory Visit | Attending: Student | Admitting: Student

## 2021-07-02 ENCOUNTER — Other Ambulatory Visit: Payer: Self-pay

## 2021-07-02 DIAGNOSIS — M5416 Radiculopathy, lumbar region: Secondary | ICD-10-CM

## 2021-07-02 DIAGNOSIS — M48061 Spinal stenosis, lumbar region without neurogenic claudication: Secondary | ICD-10-CM | POA: Diagnosis not present

## 2021-07-02 MED ORDER — GADOBENATE DIMEGLUMINE 529 MG/ML IV SOLN
20.0000 mL | Freq: Once | INTRAVENOUS | Status: AC | PRN
  Administered 2021-07-02: 20 mL via INTRAVENOUS

## 2021-07-13 DIAGNOSIS — M5416 Radiculopathy, lumbar region: Secondary | ICD-10-CM | POA: Diagnosis not present

## 2021-07-13 DIAGNOSIS — Z6832 Body mass index (BMI) 32.0-32.9, adult: Secondary | ICD-10-CM | POA: Diagnosis not present

## 2021-07-15 DIAGNOSIS — M5416 Radiculopathy, lumbar region: Secondary | ICD-10-CM | POA: Diagnosis not present

## 2021-08-11 DIAGNOSIS — M5416 Radiculopathy, lumbar region: Secondary | ICD-10-CM | POA: Diagnosis not present

## 2021-08-31 DIAGNOSIS — Z6832 Body mass index (BMI) 32.0-32.9, adult: Secondary | ICD-10-CM | POA: Diagnosis not present

## 2021-08-31 DIAGNOSIS — M5416 Radiculopathy, lumbar region: Secondary | ICD-10-CM | POA: Diagnosis not present

## 2021-09-08 DIAGNOSIS — M1712 Unilateral primary osteoarthritis, left knee: Secondary | ICD-10-CM | POA: Diagnosis not present

## 2021-12-16 DIAGNOSIS — M25552 Pain in left hip: Secondary | ICD-10-CM | POA: Diagnosis not present

## 2021-12-24 DIAGNOSIS — M25562 Pain in left knee: Secondary | ICD-10-CM | POA: Diagnosis not present

## 2021-12-28 DIAGNOSIS — M25552 Pain in left hip: Secondary | ICD-10-CM | POA: Diagnosis not present

## 2022-01-05 DIAGNOSIS — E7800 Pure hypercholesterolemia, unspecified: Secondary | ICD-10-CM | POA: Diagnosis not present

## 2022-01-05 DIAGNOSIS — R739 Hyperglycemia, unspecified: Secondary | ICD-10-CM | POA: Diagnosis not present

## 2022-01-05 DIAGNOSIS — Z125 Encounter for screening for malignant neoplasm of prostate: Secondary | ICD-10-CM | POA: Diagnosis not present

## 2022-01-05 DIAGNOSIS — I10 Essential (primary) hypertension: Secondary | ICD-10-CM | POA: Diagnosis not present

## 2022-01-05 DIAGNOSIS — Z0000 Encounter for general adult medical examination without abnormal findings: Secondary | ICD-10-CM | POA: Diagnosis not present

## 2022-01-27 DIAGNOSIS — M5416 Radiculopathy, lumbar region: Secondary | ICD-10-CM | POA: Diagnosis not present

## 2022-02-03 DIAGNOSIS — M25552 Pain in left hip: Secondary | ICD-10-CM | POA: Diagnosis not present

## 2022-02-15 DIAGNOSIS — M1712 Unilateral primary osteoarthritis, left knee: Secondary | ICD-10-CM | POA: Diagnosis not present

## 2022-02-23 DIAGNOSIS — G8918 Other acute postprocedural pain: Secondary | ICD-10-CM | POA: Diagnosis not present

## 2022-02-23 DIAGNOSIS — M1712 Unilateral primary osteoarthritis, left knee: Secondary | ICD-10-CM | POA: Diagnosis not present

## 2022-03-21 ENCOUNTER — Ambulatory Visit: Payer: Self-pay | Admitting: Physician Assistant

## 2022-05-15 DIAGNOSIS — R5383 Other fatigue: Secondary | ICD-10-CM | POA: Diagnosis not present

## 2022-05-15 DIAGNOSIS — R051 Acute cough: Secondary | ICD-10-CM | POA: Diagnosis not present

## 2022-05-15 DIAGNOSIS — U071 COVID-19: Secondary | ICD-10-CM | POA: Diagnosis not present

## 2022-05-15 DIAGNOSIS — R6883 Chills (without fever): Secondary | ICD-10-CM | POA: Diagnosis not present

## 2022-06-22 IMAGING — MG DIGITAL DIAGNOSTIC BILAT W/ TOMO W/ CAD
6 of 12 series · 6 of 36 positions shown · non-contrast
Comparison: None.

ACR Breast Density Category a: The breast tissue is almost entirely
fatty.

CLINICAL DATA: Mass felt by the patient in the upper outer right
breast for the 3 months.

EXAM:
DIGITAL DIAGNOSTIC BILATERAL MAMMOGRAM WITH TOMOSYNTHESIS AND CAD;
ULTRASOUND RIGHT BREAST LIMITED
TECHNIQUE: Bilateral digital diagnostic mammography and breast tomosynthesis
was performed. The images were evaluated with computer-aided
detection.; Targeted ultrasound examination of the right breast was
performed

[R MLO synth-2D (1 of 2)]
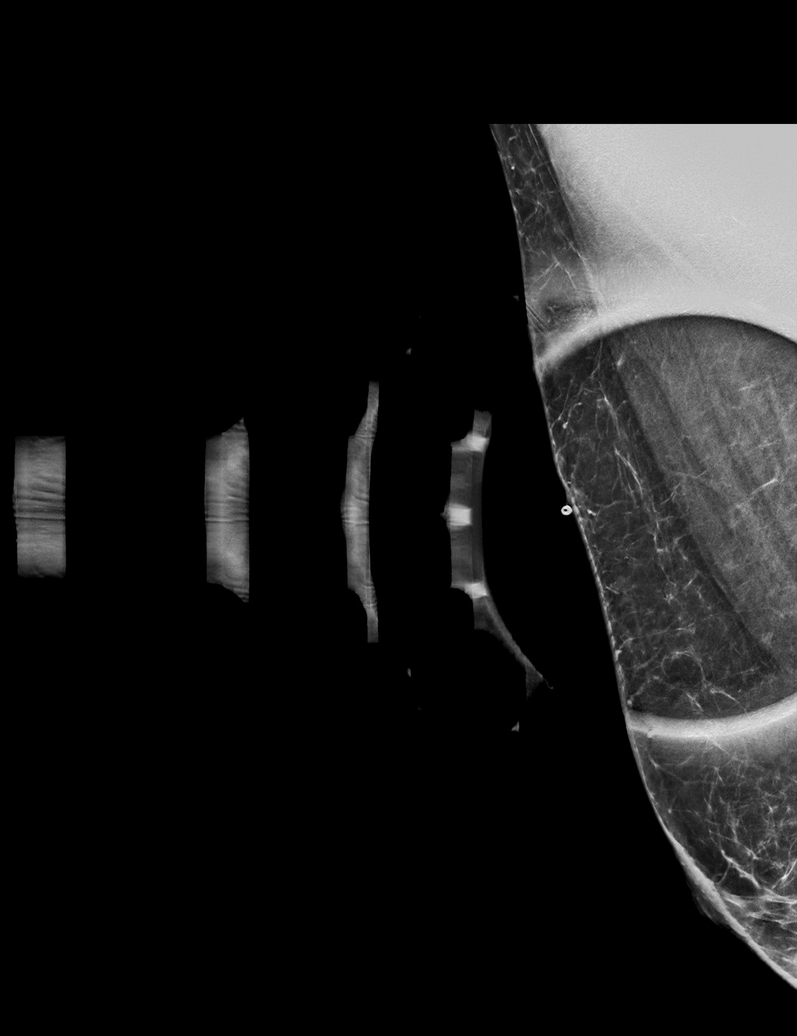

[L MLO synth-2D]
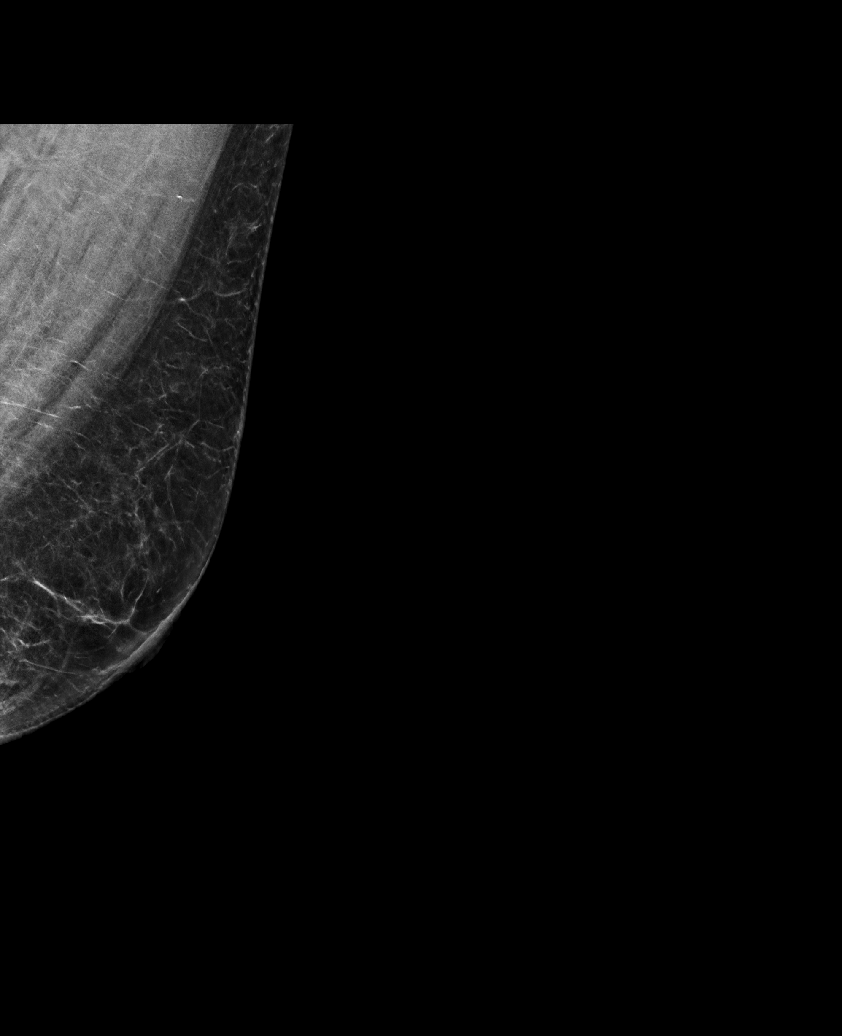

[R MLO synth-2D (2 of 2)]
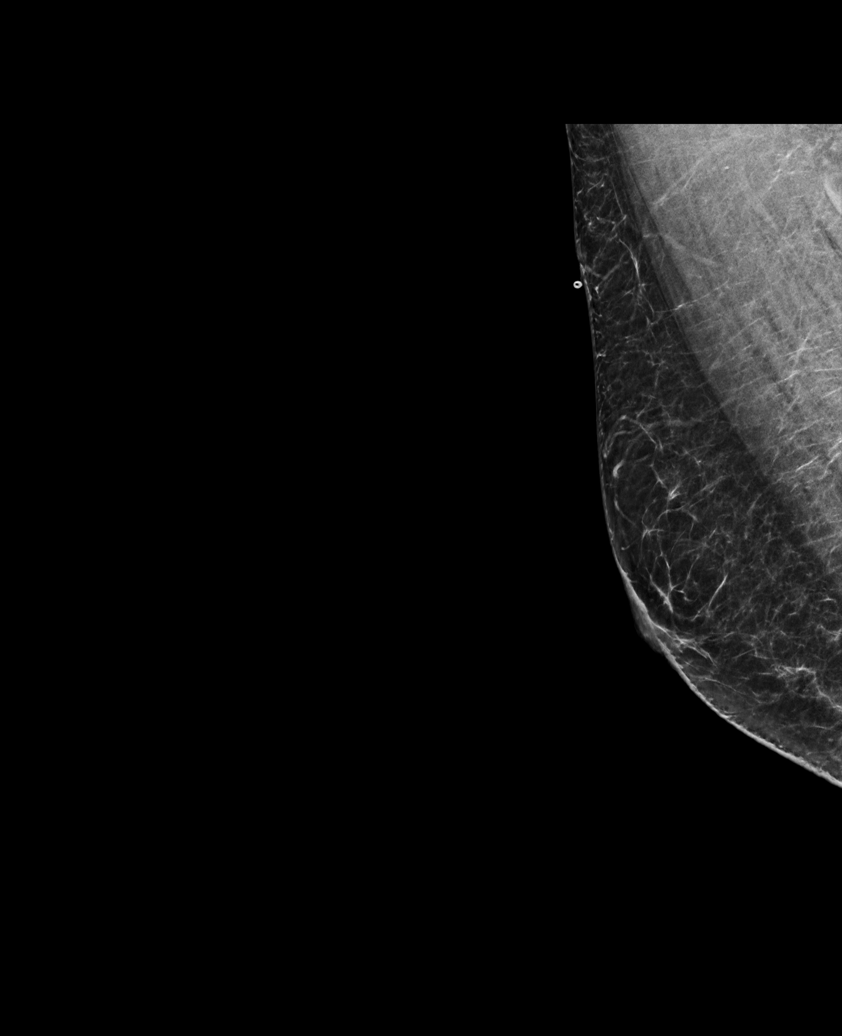

[L CC synth-2D]
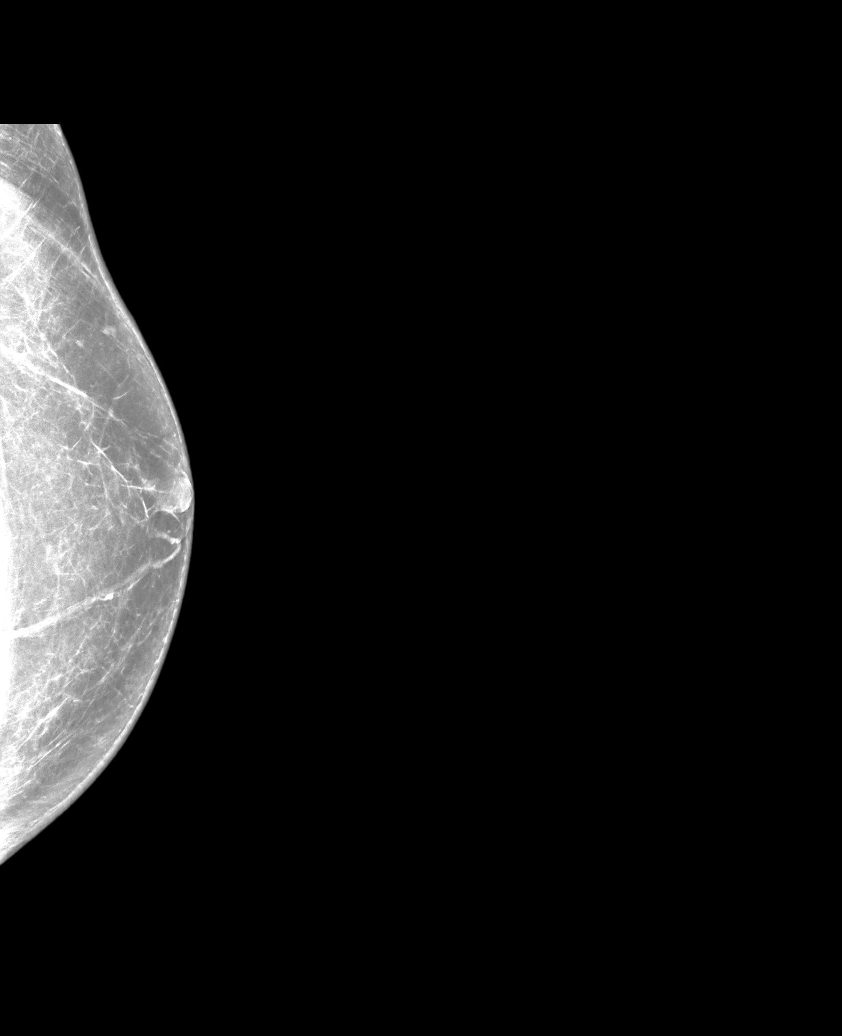

[R CC synth-2D (1 of 2)]
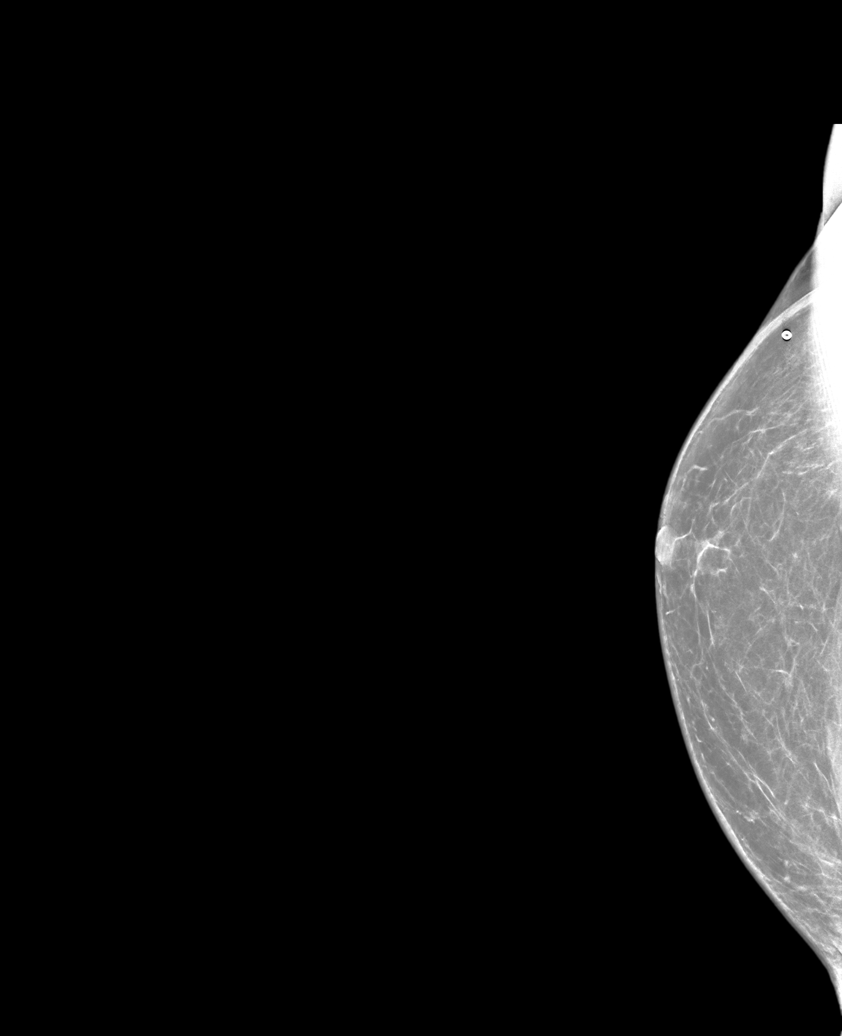

[R CC synth-2D (2 of 2)]
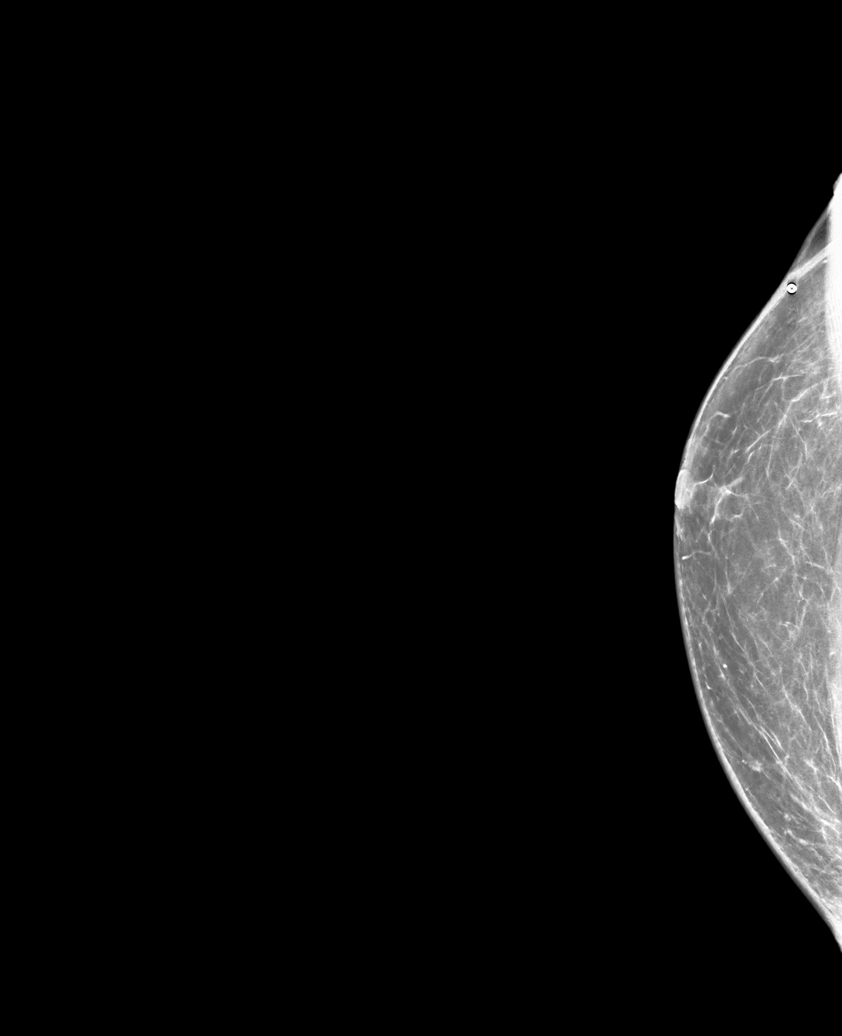

[6 of 36 positions shown; findings below may reference images not displayed]

FINDINGS: Normal appearing fatty tissue throughout both breasts. At the
location of palpable concern, there is 2.4 cm oval fat density mass
with a thin surrounding capsule just beneath the skin. No findings
suspicious for malignancy in either breast.

On physical exam, the patient has an approximately 1.5 cm oval,
circumscribed, palpable mass in the upper-outer quadrant of the
right breast corresponding to the palpable mass.

Targeted ultrasound is performed, showing a 2.1 x 1.5 x 0.8 cm oval,
horizontally oriented, circumscribed, heterogeneous mass just
beneath the skin in the below the upper-outer right breast left
corresponding to the palpable mass. This has echogenic and mildly
hypoechoic components and corresponds to the fat density mass seen
mammographically.
IMPRESSION: 2.1 cm lipoma corresponding to the palpable mass in the upper outer
right breast. No evidence of malignancy.

RECOMMENDATION:
Clinical follow-up.

I have discussed the findings and recommendations with the patient.
If applicable, a reminder letter will be sent to the patient
regarding the next appointment.

BI-RADS CATEGORY  2: Benign.

## 2022-08-23 DIAGNOSIS — M1712 Unilateral primary osteoarthritis, left knee: Secondary | ICD-10-CM | POA: Diagnosis not present

## 2022-09-27 ENCOUNTER — Other Ambulatory Visit: Payer: Self-pay

## 2022-09-27 ENCOUNTER — Emergency Department (HOSPITAL_BASED_OUTPATIENT_CLINIC_OR_DEPARTMENT_OTHER)
Admission: EM | Admit: 2022-09-27 | Discharge: 2022-09-27 | Disposition: A | Discharge status: Home / Self Care | Payer: BC Managed Care – PPO | Attending: Emergency Medicine | Admitting: Emergency Medicine

## 2022-09-27 ENCOUNTER — Encounter (HOSPITAL_BASED_OUTPATIENT_CLINIC_OR_DEPARTMENT_OTHER): Payer: Self-pay

## 2022-09-27 ENCOUNTER — Emergency Department (HOSPITAL_BASED_OUTPATIENT_CLINIC_OR_DEPARTMENT_OTHER): Payer: BC Managed Care – PPO

## 2022-09-27 DIAGNOSIS — Z20822 Contact with and (suspected) exposure to covid-19: Secondary | ICD-10-CM | POA: Insufficient documentation

## 2022-09-27 DIAGNOSIS — Z79899 Other long term (current) drug therapy: Secondary | ICD-10-CM | POA: Insufficient documentation

## 2022-09-27 DIAGNOSIS — I10 Essential (primary) hypertension: Secondary | ICD-10-CM | POA: Insufficient documentation

## 2022-09-27 DIAGNOSIS — Z8616 Personal history of COVID-19: Secondary | ICD-10-CM | POA: Insufficient documentation

## 2022-09-27 DIAGNOSIS — R059 Cough, unspecified: Secondary | ICD-10-CM | POA: Diagnosis not present

## 2022-09-27 DIAGNOSIS — J209 Acute bronchitis, unspecified: Secondary | ICD-10-CM | POA: Diagnosis not present

## 2022-09-27 DIAGNOSIS — J4521 Mild intermittent asthma with (acute) exacerbation: Secondary | ICD-10-CM | POA: Diagnosis not present

## 2022-09-27 DIAGNOSIS — R0602 Shortness of breath: Secondary | ICD-10-CM | POA: Diagnosis not present

## 2022-09-27 DIAGNOSIS — J40 Bronchitis, not specified as acute or chronic: Secondary | ICD-10-CM

## 2022-09-27 LAB — RESP PANEL BY RT-PCR (RSV, FLU A&B, COVID)  RVPGX2
Influenza A by PCR: NEGATIVE
Influenza B by PCR: NEGATIVE
Resp Syncytial Virus by PCR: NEGATIVE
SARS Coronavirus 2 by RT PCR: NEGATIVE

## 2022-09-27 LAB — BASIC METABOLIC PANEL
Anion gap: 10 (ref 5–15)
BUN: 13 mg/dL (ref 8–23)
CO2: 26 mmol/L (ref 22–32)
Calcium: 8.6 mg/dL — ABNORMAL LOW (ref 8.9–10.3)
Chloride: 102 mmol/L (ref 98–111)
Creatinine, Ser: 1.08 mg/dL (ref 0.61–1.24)
GFR, Estimated: >60 mL/min (ref >60)
Glucose, Bld: 147 mg/dL — ABNORMAL HIGH (ref 70–99)
Potassium: 3.9 mmol/L (ref 3.5–5.1)
Sodium: 138 mmol/L (ref 135–145)

## 2022-09-27 LAB — CBC
HCT: 45.7 % (ref 39.0–52.0)
Hemoglobin: 15.1 g/dL (ref 13.0–17.0)
MCH: 30.8 pg (ref 26.0–34.0)
MCHC: 33 g/dL (ref 30.0–36.0)
MCV: 93.3 fL (ref 80.0–100.0)
Platelets: 164 10*3/uL (ref 150–400)
RBC: 4.9 MIL/uL (ref 4.22–5.81)
RDW: 13.6 % (ref 11.5–15.5)
WBC: 5.8 10*3/uL (ref 4.0–10.5)
nRBC: 0 % (ref 0.0–0.2)

## 2022-09-27 MED ORDER — ALBUTEROL SULFATE HFA 108 (90 BASE) MCG/ACT IN AERS
2.0000 | INHALATION_SPRAY | Freq: Once | RESPIRATORY_TRACT | Status: AC
  Administered 2022-09-27: 2 via RESPIRATORY_TRACT
  Filled 2022-09-27: qty 6.7

## 2022-09-27 MED ORDER — AEROCHAMBER PLUS FLO-VU MEDIUM MISC
1.0000 | Freq: Once | Status: AC
  Administered 2022-09-27: 1
  Filled 2022-09-27: qty 10

## 2022-09-27 MED ORDER — ALBUTEROL SULFATE HFA 108 (90 BASE) MCG/ACT IN AERS: 1.0000 | INHALATION_SPRAY | Freq: Four times a day (QID) | RESPIRATORY_TRACT | 0 refills | Status: AC | PRN

## 2022-09-27 MED ORDER — IPRATROPIUM-ALBUTEROL 0.5-2.5 (3) MG/3ML IN SOLN: 6.0000 mL | Freq: Once | RESPIRATORY_TRACT | Status: AC

## 2022-09-27 MED ORDER — PREDNISONE 20 MG PO TABS: 40.0000 mg | ORAL_TABLET | Freq: Every day | ORAL | 0 refills | Status: DC

## 2022-09-27 MED ORDER — HYDROCOD POLI-CHLORPHE POLI ER 10-8 MG/5ML PO SUER: 5.0000 mL | Freq: Every evening | ORAL | 0 refills | Status: AC | PRN

## 2022-09-27 MED ORDER — IPRATROPIUM-ALBUTEROL 0.5-2.5 (3) MG/3ML IN SOLN
RESPIRATORY_TRACT | Status: AC
  Administered 2022-09-27: 6 mL
  Filled 2022-09-27: qty 6

## 2022-09-27 NOTE — ED Notes (Signed)
Discharge instructions reviewed with patient. Patient verbalizes understanding, no further questions at this time. Medications/prescriptions and follow up information provided. No acute distress noted at time of departure.  

## 2022-09-27 NOTE — ED Provider Notes (Signed)
[facility] Provider Note   CSN: [id number] Arrival date & time: 09/27/22  0756     History  Chief Complaint  Patient presents with   Shortness of Breath    Joel Ray is a 62 y.o. male with past medical history significant for hypertension who presents with concern for cough, congestion, shortness of breath, wheezing after returning from a [place] trip on Saturday.  Patient and wife report that he began coughing on Friday, symptoms worsened on Saturday, and then yesterday into today patient was having some significant wheezing, tripoding, shortness of breath.  Cough productive of sputum.  No fever at home noted.  Patient denies any previous history of asthma, COPD, denies any tobacco use.  Patient reports that he has had some persistent expiratory wheeze since the last time he had COVID.  He has had 2 DuoNeb treatments prior to my assessment and reports that he is feeling significantly improved after breathing treatment.   Shortness of Breath      Home Medications Prior to Admission medications   Medication Sig Start Date End Date Taking? Authorizing Provider  albuterol (VENTOLIN HFA) 108 (90 Base) MCG/ACT inhaler Inhale 1-2 puffs into the lungs every 6 (six) hours as needed for wheezing or shortness of breath. 09/27/22  Yes Zaydrian Batta H, PA-C  chlorpheniramine-HYDROcodone (TUSSIONEX) 10-8 MG/5ML Take 5 mLs by mouth at bedtime as needed for cough. 09/27/22  Yes Seferino Oscar H, PA-C  predniSONE (DELTASONE) 20 MG tablet Take 2 tablets (40 mg total) by mouth daily. 09/27/22  Yes Leilani Cespedes H, PA-C  hydrochlorothiazide (HYDRODIURIL) 25 MG tablet Take 25 mg by mouth daily. 10/11/18   [provider]  olmesartan (BENICAR) 40 MG tablet Take 40 mg by mouth daily. 10/10/18   [provider]      Allergies    Patient has no known allergies.    Review of Systems   Review of Systems  Respiratory:  Positive  for shortness of breath.   All other systems reviewed and are negative.   Physical Exam Updated Vital Signs BP (!) 146/88 (BP Location: Right Leg)   Pulse 76   Temp 98.6 F (37 C) (Oral)   Resp 20   Ht 6\' 4"  (1.93 m)   Wt 113.4 kg   SpO2 99%   BMI 30.43 kg/m  Physical Exam Vitals and nursing note reviewed.  Constitutional:      General: He is not in acute distress.    Appearance: Normal appearance.  HENT:     Head: Normocephalic and atraumatic.  Eyes:     General:        Right eye: No discharge.        Left eye: No discharge.  Cardiovascular:     Rate and Rhythm: Normal rate and regular rhythm.     Heart sounds: No murmur heard.    No friction rub. No gallop.     Comments: No murmurs, rubs, gallops Pulmonary:     Effort: Pulmonary effort is normal.     Breath sounds: Normal breath sounds.     Comments: On the right patient with clear inspiratory, expiratory movement, on the left with some mild expiratory wheeze, scattered rhonchi, no crackles, stridor, patient not in respiratory distress on my assessment. Abdominal:     General: Bowel sounds are normal.     Palpations: Abdomen is soft.  Skin:    General: Skin is warm and dry.     Capillary  Refill: Capillary refill takes less than 2 seconds.  Neurological:     Mental Status: He is alert and oriented to person, place, and time.  Psychiatric:        Mood and Affect: Mood normal.        Behavior: Behavior normal.     ED Results / Procedures / Treatments   Labs (all labs ordered are listed, but only abnormal results are displayed) Labs Reviewed  BASIC METABOLIC PANEL - Abnormal; Notable for the following components:      Result Value   Glucose, Bld 147 (*)    Calcium 8.6 (*)    All other components within normal limits  RESP PANEL BY RT-PCR (RSV, FLU A&B, COVID)  RVPGX2  CBC    EKG None  Radiology DG Chest 2 View  Result Date: 09/27/2022 CLINICAL DATA:  Shortness of breath and productive cough after long  flight EXAM: CHEST - 2 VIEW COMPARISON:  Chest radiograph dated 12/02/2019, 09/08/2006 FINDINGS: Normal lung volumes. No focal consolidations. Unchanged bilateral scattered nodular opacities, likely calcified granulomas. No pleural effusion or pneumothorax. The heart size and mediastinal contours are within normal limits. No acute osseous abnormality. IMPRESSION: No active cardiopulmonary disease. Electronically Signed   By: Agustin Cree M.D.   On: 09/27/2022 08:50    Procedures Procedures    Medications Ordered in ED Medications  albuterol (VENTOLIN HFA) 108 (90 Base) MCG/ACT inhaler 2 puff (has no administration in time range)  AeroChamber Plus Flo-Vu Medium MISC 1 each (has no administration in time range)  ipratropium-albuterol (DUONEB) 0.5-2.5 (3) MG/3ML nebulizer solution 6 mL ( Nebulization Canceled Entry 09/27/22 0907)    ED Course/ Medical Decision Making/ A&P                             Medical Decision Making Amount and/or Complexity of Data Reviewed Labs: ordered. Radiology: ordered.  Risk Prescription drug management.   This patient is a 62 y.o. male  who presents to the ED for concern of cough, congestion, shortness of breath.   Differential diagnoses prior to evaluation: The emergent differential diagnosis includes, but is not limited to,  asthma exacerbation, COPD exacerbation, acute upper respiratory infection, acute bronchitis, chronic bronchitis, interstitial lung disease, ARDS, PE, pneumonia, atypical ACS, carbon monoxide poisoning, spontaneous pneumothorax, new CHF vs CHF exacerbation, versus other . This is not an exhaustive differential.   Past Medical History / Co-morbidities / Social History: Hypertension, post-covid wheeze  Physical Exam: Physical exam performed. The pertinent findings include: On the right patient with clear inspiratory, expiratory movement, on the left with some mild expiratory wheeze, scattered rhonchi, no crackles, stridor, patient not in  respiratory distress on my assessment.  Lab Tests/Imaging studies: I personally interpreted labs/imaging and the pertinent results include: Independently interpreted RVP which is negative for COVID, flu, RSV.  And eval interpreted plain film chest x-ray shows no evidence of focal consolidation or other acute intrathoracic abnormality.  CBC and BMP shows no acute abnormality other than mild non-fasting hyperglycemia. I agree with the radiologist interpretation.  Cardiac monitoring: EKG obtained and interpreted by my attending physician which shows: NSR, right axis deviation   Medications: I ordered medication including duoneb, albuterol for cough, wheezing.  I have reviewed the patients home medicines and have made adjustments as needed.  Patient with significant improvement after DuoNeb, albuterol, will send him home with prednisone, albuterol, Tussionex for cough.   Disposition: After consideration of the diagnostic  results and the patients response to treatment, I feel that patient with evidence of asthma exacerbation versus without previous confirmed diagnosis of asthma reactive airway disease, breathing significantly improved after DuoNeb x 2, will discharge patient with prednisone and inhaler, as well as encourage close PCP and pulmonology follow-up.   emergency department workup does not suggest an emergent condition requiring admission or immediate intervention beyond what has been performed at this time. The plan is: as above. The patient is safe for discharge and has been instructed to return immediately for worsening symptoms, change in symptoms or any other concerns.  Final Clinical Impression(s) / ED Diagnoses Final diagnoses:  Mild intermittent reactive airway disease with acute exacerbation  Bronchitis    Rx / DC Orders ED Discharge Orders          Ordered    predniSONE (DELTASONE) 20 MG tablet  Daily        09/27/22 1004    chlorpheniramine-HYDROcodone (TUSSIONEX) 10-8  MG/5ML  At bedtime PRN        09/27/22 1004    albuterol (VENTOLIN HFA) 108 (90 Base) MCG/ACT inhaler  Every 6 hours PRN        09/27/22 1004              Maimouna Rondeau, [place], PA-C 09/27/22 1007    Rondel Baton, MD 09/29/22 1038

## 2022-09-27 NOTE — Discharge Instructions (Addendum)
As we discussed I would follow-up with your primary care doctor to see if they would like you to have a formal pulmonology evaluation given your persistent reactive airway disease in the past and recent exacerbation.  Please take the entire course of steroids that I prescribed, you can use the cough medicine as needed for severe cough especially at night.  If you develop some persistent rib, abdominal pain from your frequent coughing; Please use Tylenol or ibuprofen for pain.  You may use 600 mg ibuprofen every 6 hours or 1000 mg of Tylenol every 6 hours.  You may choose to alternate between the 2.  This would be most effective.  Not to exceed 4 g of Tylenol within 24 hours.  Not to exceed 3200 mg ibuprofen 24 hours.

## 2022-09-27 NOTE — ED Triage Notes (Addendum)
States had 8 hour flight on Saturday, started having shortness of breath Sunday. Inspiratory & expiratory wheezes in triage. Productive cough. Recent cruise.

## 2022-11-23 ENCOUNTER — Institutional Professional Consult (permissible substitution): Payer: BC Managed Care – PPO | Admitting: Pulmonary Disease

## 2022-12-14 DIAGNOSIS — L219 Seborrheic dermatitis, unspecified: Secondary | ICD-10-CM | POA: Diagnosis not present

## 2022-12-14 DIAGNOSIS — D485 Neoplasm of uncertain behavior of skin: Secondary | ICD-10-CM | POA: Diagnosis not present

## 2022-12-14 DIAGNOSIS — D225 Melanocytic nevi of trunk: Secondary | ICD-10-CM | POA: Diagnosis not present

## 2022-12-14 DIAGNOSIS — D0461 Carcinoma in situ of skin of right upper limb, including shoulder: Secondary | ICD-10-CM | POA: Diagnosis not present

## 2022-12-14 DIAGNOSIS — C44712 Basal cell carcinoma of skin of right lower limb, including hip: Secondary | ICD-10-CM | POA: Diagnosis not present

## 2022-12-14 DIAGNOSIS — L570 Actinic keratosis: Secondary | ICD-10-CM | POA: Diagnosis not present

## 2022-12-14 DIAGNOSIS — D224 Melanocytic nevi of scalp and neck: Secondary | ICD-10-CM | POA: Diagnosis not present

## 2022-12-14 DIAGNOSIS — Z85828 Personal history of other malignant neoplasm of skin: Secondary | ICD-10-CM | POA: Diagnosis not present

## 2022-12-28 ENCOUNTER — Ambulatory Visit: Payer: BC Managed Care – PPO | Admitting: Pulmonary Disease

## 2022-12-28 ENCOUNTER — Encounter: Payer: Self-pay | Admitting: Pulmonary Disease

## 2022-12-28 VITALS — BP 128/78 | HR 57 | Ht 76.0 in | Wt 244.0 lb

## 2022-12-28 DIAGNOSIS — R062 Wheezing: Secondary | ICD-10-CM

## 2022-12-28 LAB — CBC WITH DIFFERENTIAL/PLATELET
Basophils Absolute: 0 10*3/uL (ref 0.0–0.1)
Basophils Relative: 0.6 % (ref 0.0–3.0)
Eosinophils Absolute: 0.2 10*3/uL (ref 0.0–0.7)
Eosinophils Relative: 3.7 % (ref 0.0–5.0)
HCT: 47.2 % (ref 39.0–52.0)
Hemoglobin: 15.6 g/dL (ref 13.0–17.0)
Lymphocytes Relative: 17.2 % (ref 12.0–46.0)
Lymphs Abs: 1 10*3/uL (ref 0.7–4.0)
MCHC: 33.2 g/dL (ref 30.0–36.0)
MCV: 94 fl (ref 78.0–100.0)
Monocytes Absolute: 0.6 10*3/uL (ref 0.1–1.0)
Monocytes Relative: 10.7 % (ref 3.0–12.0)
Neutro Abs: 4.1 10*3/uL (ref 1.4–7.7)
Neutrophils Relative %: 67.8 % (ref 43.0–77.0)
Platelets: 238 10*3/uL (ref 150.0–400.0)
RBC: 5.02 Mil/uL (ref 4.22–5.81)
RDW: 14.1 % (ref 11.5–15.5)
WBC: 6 10*3/uL (ref 4.0–10.5)

## 2022-12-28 MED ORDER — BUDESONIDE-FORMOTEROL FUMARATE 160-4.5 MCG/ACT IN AERO: 2.0000 | INHALATION_SPRAY | Freq: Every day | RESPIRATORY_TRACT | 12 refills | Status: DC

## 2022-12-28 NOTE — Progress Notes (Signed)
Joel Ray    [id number]    Jun 25, 1960  Primary Care Physician:Harris, Tawni Pummel, MD  Referring Physician: Noberto Retort, MD [phone] [address] [place],  [place] [address]  Chief complaint: Consult for wheezing  HPI: 62 y.o. who  has a past medical history of Back pain, Hypertension, and Neck pain.   62 year old with past medical history as above.  He developed COVID-19 in 2020 but did not require hospitalization.  Since then he has had mild intermittent wheezing.  Treated with albuterol and Advair on and off.  Has history of recurrent bronchitis.  Went to a trip to [place] in June 2024 and then developed an exacerbation.  He was seen in the ED with a normal chest x-ray.  Given prednisone and nebulizers.  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  No feather pillows or comforters Smoking history: Never smoker Travel history: Originally from [place] Relevant family history: Mom had COPD, grand parents had lung cancer.  They are all heavy smokers.   Outpatient Encounter Medications as of 12/28/2022  Medication Sig   albuterol (VENTOLIN HFA) 108 (90 Base) MCG/ACT inhaler Inhale 1-2 puffs into the lungs every 6 (six) hours as needed for wheezing or shortness of breath.   chlorpheniramine-HYDROcodone (TUSSIONEX) 10-8 MG/5ML Take 5 mLs by mouth at bedtime as needed for cough.   hydrochlorothiazide (HYDRODIURIL) 25 MG tablet Take 25 mg by mouth daily.   olmesartan (BENICAR) 40 MG tablet Take 40 mg by mouth daily.   predniSONE (DELTASONE) 20 MG tablet Take 2 tablets (40 mg total) by mouth daily. (Patient not taking: Reported on 12/28/2022)   No facility-administered encounter medications on file as of 12/28/2022.    Allergies as of 12/28/2022   (No Known Allergies)    Past Medical History:  Diagnosis Date   Back pain    Hypertension    Neck pain     Past Surgical History:  Procedure  Laterality Date   BACK SURGERY  08/2018   CHOLECYSTECTOMY     KNEE SURGERY      Family History  Problem Relation Age of Onset   Lupus Mother    Heart attack Father     Social History   Socioeconomic History   Marital status: Married    Spouse name: Not on file   Number of children: Not on file   Years of education: Not on file   Highest education level: Not on file  Occupational History   Not on file  Tobacco Use   Smoking status: Never   Smokeless tobacco: Never  Substance and Sexual Activity   Alcohol use: Yes   Drug use: No   Sexual activity: Not on file  Other Topics Concern   Not on file  Social History Narrative   Not on file   Social Determinants of Health   Financial Resource Strain: Not on file  Food Insecurity: Not on file  Transportation Needs: Not on file  Physical Activity: Not on file  Stress: Not on file  Social Connections: Not on file  Intimate Partner Violence: Not on file    Review of systems: Review of Systems  Constitutional: Negative for fever and chills.  HENT: Negative.   Eyes: Negative for blurred vision.  Respiratory: as per HPI  Cardiovascular: Negative for chest pain and palpitations.  Gastrointestinal: Negative for vomiting, diarrhea, blood per rectum. Genitourinary: Negative for  dysuria, urgency, frequency and hematuria.  Musculoskeletal: Negative for myalgias, back pain and joint pain.  Skin: Negative for itching and rash.  Neurological: Negative for dizziness, tremors, focal weakness, seizures and loss of consciousness.  Endo/Heme/Allergies: Negative for environmental allergies.  Psychiatric/Behavioral: Negative for depression, suicidal ideas and hallucinations.  All other systems reviewed and are negative.  Physical Exam: Blood pressure 128/78, pulse (!) 57, height 6\' 4"  (1.93 m), weight 244 lb (110.7 kg), SpO2 98%. Gen:      No acute distress HEENT:  EOMI, sclera anicteric Neck:     No masses; no thyromegaly Lungs:     Clear to auscultation bilaterally; normal respiratory effort CV:         Regular rate and rhythm; no murmurs Abd:      + bowel sounds; soft, non-tender; no palpable masses, no distension Ext:    No edema; adequate peripheral perfusion Skin:      Warm and dry; no rash Neuro: alert and oriented x 3 Psych: normal mood and affect  Data Reviewed: Imaging: Chest x-ray 09/27/22-no active cardiopulmonary disease I reviewed the images personally.  PFTs:  FENO 12/28/2022-14  Labs:  Assessment:  Consult for dyspnea, intermittent wheezing May have reactive airway disease which is exacerbated by COVID-19 infection Needs to use prednisone at least 2-3 times a week for exacerbations Will check CBC differential, IgE today Schedule PFTs Start Symbicort 160/4.5  Plan/Recommendations: CBC, IgE PFTs Symbicort  Chilton Greathouse MD [facility] 12/28/2022, 8:30 AM  CC: Noberto Retort, MD

## 2022-12-28 NOTE — Patient Instructions (Signed)
Will check a CBC with differential and IgE today Start Symbicort 160/4.5.  Take 2 puffs twice daily Schedule PFTs in 4 months Return to clinic in 4 months

## 2022-12-29 LAB — IGE: IgE (Immunoglobulin E), Serum: 19 kU/L (ref <=114)

## 2023-01-18 DIAGNOSIS — Z0000 Encounter for general adult medical examination without abnormal findings: Secondary | ICD-10-CM | POA: Diagnosis not present

## 2023-01-18 DIAGNOSIS — Z23 Encounter for immunization: Secondary | ICD-10-CM | POA: Diagnosis not present

## 2023-01-18 DIAGNOSIS — E7800 Pure hypercholesterolemia, unspecified: Secondary | ICD-10-CM | POA: Diagnosis not present

## 2023-01-18 DIAGNOSIS — I10 Essential (primary) hypertension: Secondary | ICD-10-CM | POA: Diagnosis not present

## 2023-01-18 DIAGNOSIS — Z125 Encounter for screening for malignant neoplasm of prostate: Secondary | ICD-10-CM | POA: Diagnosis not present

## 2023-03-13 DIAGNOSIS — L570 Actinic keratosis: Secondary | ICD-10-CM | POA: Diagnosis not present

## 2023-05-03 ENCOUNTER — Ambulatory Visit: Payer: BC Managed Care – PPO | Admitting: Pulmonary Disease

## 2023-05-03 ENCOUNTER — Encounter: Payer: Self-pay | Admitting: Pulmonary Disease

## 2023-05-03 VITALS — BP 135/88 | HR 68 | Ht 76.0 in | Wt 255.6 lb

## 2023-05-03 DIAGNOSIS — R062 Wheezing: Secondary | ICD-10-CM | POA: Diagnosis not present

## 2023-05-03 NOTE — Progress Notes (Signed)
 Marland Kitchen

## 2023-05-03 NOTE — Patient Instructions (Signed)
 VISIT SUMMARY:  During your visit, we discussed your breathing issues and back pain. You reported that your breathing has improved significantly with Symbicort , and you are no longer experiencing wheezing. However, you continue to have constant burning pain in your back and hip, which limits your physical activity. We also reviewed your general health maintenance.  YOUR PLAN:  -RESPIRATORY SYMPTOMS: You have shown improvement in your breathing and wheezing since starting Symbicort , which suggests you may have mild asthma. Asthma is a condition where your airways narrow and swell, making it difficult to breathe. We will continue your current medication, Symbicort , and plan to do a lung function test in 6 months to establish a baseline for your lung health.  -CHRONIC BACK PAIN: You are experiencing constant burning pain in your back and hip, which limits your physical activity. Chronic back pain is long-lasting pain in your back that can affect your daily activities. We did not make any changes to your current management plan for this issue.  -GENERAL HEALTH MAINTENANCE: We reviewed your general health and routine labs, which are normal. It is important to maintain a healthy lifestyle, including regular exercise, a balanced diet, and avoiding smoking. Your next appointment is scheduled in 6 months.  INSTRUCTIONS:  Please continue taking Symbicort  as prescribed. We will schedule a lung function test in 6 months to assess your lung health. Your next appointment is also in 6 months. If your back pain worsens or you have any new symptoms, please contact our office.

## 2023-05-03 NOTE — Progress Notes (Signed)
 Joel Ray    [id number]    1961/01/03  Primary Care Physician:Harris, Elsie SAUNDERS, MD  Referring Physician: Arloa Elsie SAUNDERS, MD [phone] [address] [place],  [place] [address]  Chief complaint: Consult for wheezing  HPI: 63 y.o. who  has a past medical history of Back pain, Hypertension, and Neck pain.   63 year old with past medical history as above.  He developed COVID-19 in 2020 but did not require hospitalization.  Since then he has had mild intermittent wheezing.  Treated with albuterol  and Advair on and off.  Has history of recurrent bronchitis.  Went to a trip to [place] in June 2024 and then developed an exacerbation.  He was seen in the ED with a normal chest x-ray.  Given prednisone  and nebulizers.  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  No feather pillows or comforters Smoking history: Never smoker Travel history: Originally from [place]  Relevant family history: Mom had COPD, grand parents had lung cancer.  They are all heavy smokers.  Interim history: Discussed the use of AI scribe software for clinical note transcription with the patient, who gave verbal consent to proceed.  The patient, with a history of breathing issues and back pain, reports improvement in his breathing since starting Symbicort . He notes that he is no longer wheezing and is able to engage in physical activities without limitations from his breathing. However, he continues to experience constant burning pain in his back and hip, which limits his activity. He can only engage in physical activity for about 15-20 minutes before needing to rest and stretch. Despite this, he is still able to achieve about 6-7 thousand steps a day. He denies current use of prednisone .   Outpatient Encounter Medications as of 05/03/2023  Medication Sig   albuterol  (VENTOLIN  HFA) 108 (90 Base) MCG/ACT inhaler Inhale 1-2 puffs into the  lungs every 6 (six) hours as needed for wheezing or shortness of breath.   budesonide -formoterol  (SYMBICORT ) 160-4.5 MCG/ACT inhaler Inhale 2 puffs into the lungs daily.   chlorpheniramine-HYDROcodone (TUSSIONEX) 10-8 MG/5ML Take 5 mLs by mouth at bedtime as needed for cough.   hydrochlorothiazide (HYDRODIURIL) 25 MG tablet Take 25 mg by mouth daily.   olmesartan (BENICAR) 40 MG tablet Take 40 mg by mouth daily.   predniSONE  (DELTASONE ) 20 MG tablet Take 2 tablets (40 mg total) by mouth daily. (Patient not taking: Reported on 05/03/2023)   No facility-administered encounter medications on file as of 05/03/2023.   Physical Exam: Blood pressure 135/88, pulse 68, height 6' 4 (1.93 m), weight 255 lb 9.6 oz (115.9 kg), SpO2 97%. Gen:      No acute distress HEENT:  EOMI, sclera anicteric Neck:     No masses; no thyromegaly Lungs:    Clear to auscultation bilaterally; normal respiratory effort CV:         Regular rate and rhythm; no murmurs Abd:      + bowel sounds; soft, non-tender; no palpable masses, no distension Ext:    No edema; adequate peripheral perfusion Skin:      Warm and dry; no rash Neuro: alert and oriented x 3 Psych: normal mood and affect   Data Reviewed: Imaging: Chest x-ray 09/27/22-no active cardiopulmonary disease I reviewed the images personally.  PFTs:  FENO 12/28/2022-14  Labs: CBC 12/28/2022- WBC 6.0, eos 3.7%, absolute eosinophil count 222 IgE 12/28/2022- 19  Assessment:  Mild asthma  Improvement in breathing and wheezing with Symbicort . No current limitations from breathing. Suspected mild asthma. -Continue Symbicort . -Plan for lung function test in 6 months for baseline assessment.  Chronic Back Pain Constant burning pain in the back and hip, limiting physical activity. No current treatment mentioned. -No changes to current management plan discussed.  General Health Maintenance -Next appointment in 6 months.   Plan/Recommendations: Continue Symbicort  PFTs  and follow-up in 6 months  Lonna Coder MD [facility] 05/03/2023, 8:42 AM  CC: Arloa Elsie SAUNDERS, MD

## 2023-05-09 DIAGNOSIS — L570 Actinic keratosis: Secondary | ICD-10-CM | POA: Diagnosis not present

## 2023-06-28 DIAGNOSIS — Z125 Encounter for screening for malignant neoplasm of prostate: Secondary | ICD-10-CM | POA: Diagnosis not present

## 2023-07-17 DIAGNOSIS — L570 Actinic keratosis: Secondary | ICD-10-CM | POA: Diagnosis not present

## 2023-09-13 DIAGNOSIS — D235 Other benign neoplasm of skin of trunk: Secondary | ICD-10-CM | POA: Diagnosis not present

## 2023-09-13 DIAGNOSIS — D1801 Hemangioma of skin and subcutaneous tissue: Secondary | ICD-10-CM | POA: Diagnosis not present

## 2023-09-13 DIAGNOSIS — D485 Neoplasm of uncertain behavior of skin: Secondary | ICD-10-CM | POA: Diagnosis not present

## 2023-09-13 DIAGNOSIS — L570 Actinic keratosis: Secondary | ICD-10-CM | POA: Diagnosis not present

## 2023-09-13 DIAGNOSIS — L820 Inflamed seborrheic keratosis: Secondary | ICD-10-CM | POA: Diagnosis not present

## 2023-09-13 DIAGNOSIS — L219 Seborrheic dermatitis, unspecified: Secondary | ICD-10-CM | POA: Diagnosis not present

## 2024-01-15 ENCOUNTER — Ambulatory Visit: Admitting: Pulmonary Disease

## 2024-01-15 ENCOUNTER — Encounter: Payer: Self-pay | Admitting: Pulmonary Disease

## 2024-01-15 VITALS — BP 132/80 | HR 70 | Temp 98.2°F | Ht 75.0 in | Wt 265.0 lb

## 2024-01-15 DIAGNOSIS — J4530 Mild persistent asthma, uncomplicated: Secondary | ICD-10-CM

## 2024-01-15 DIAGNOSIS — R062 Wheezing: Secondary | ICD-10-CM

## 2024-01-15 LAB — PULMONARY FUNCTION TEST
DL/VA % pred: 110 %
DL/VA: 4.49 ml/min/mmHg/L
DLCO cor % pred: 94 %
DLCO cor: 29.37 ml/min/mmHg
DLCO unc % pred: 94 %
DLCO unc: 29.37 ml/min/mmHg
FEF 25-75 Post: 2.82 L/s
FEF 25-75 Pre: 2.63 L/s
FEF2575-%Change-Post: 7 %
FEF2575-%Pred-Post: 87 %
FEF2575-%Pred-Pre: 82 %
FEV1-%Change-Post: 2 %
FEV1-%Pred-Post: 81 %
FEV1-%Pred-Pre: 79 %
FEV1-Post: 3.35 L
FEV1-Pre: 3.26 L
FEV1FVC-%Change-Post: 1 %
FEV1FVC-%Pred-Pre: 97 %
FEV6-%Change-Post: 2 %
FEV6-%Pred-Post: 86 %
FEV6-%Pred-Pre: 84 %
FEV6-Post: 4.52 L
FEV6-Pre: 4.42 L
FEV6FVC-%Change-Post: 0 %
FEV6FVC-%Pred-Post: 104 %
FEV6FVC-%Pred-Pre: 103 %
FVC-%Change-Post: 1 %
FVC-%Pred-Post: 82 %
FVC-%Pred-Pre: 81 %
FVC-Post: 4.54 L
FVC-Pre: 4.46 L
Post FEV1/FVC ratio: 74 %
Post FEV6/FVC ratio: 100 %
Pre FEV1/FVC ratio: 73 %
Pre FEV6/FVC Ratio: 99 %
RV % pred: 94 %
RV: 2.48 L
TLC % pred: 88 %
TLC: 7.12 L

## 2024-01-15 LAB — POCT EXHALED NITRIC OXIDE: FeNO level (ppb): 13

## 2024-01-15 MED ORDER — BUDESONIDE-FORMOTEROL FUMARATE 160-4.5 MCG/ACT IN AERO: 2.0000 | INHALATION_SPRAY | Freq: Every day | RESPIRATORY_TRACT | 3 refills | Status: DC

## 2024-01-15 NOTE — Patient Instructions (Signed)
 Full PFT performed today.

## 2024-01-15 NOTE — Progress Notes (Signed)
 Full PFT performed today.

## 2024-01-15 NOTE — Addendum Note (Signed)
 Addended by: MOODY RANCHER on: 01/15/2024 03:00 PM   Modules accepted: Orders

## 2024-01-15 NOTE — Progress Notes (Signed)
 Joel Ray    [id number]    05-24-1960  Primary Care Physician:Harris, Elsie SAUNDERS, MD  Referring Physician: Cova Knieriem, MD [address] [place],  [place] [address]  Chief complaint: Follow-up for asthma  HPI: 63 y.o. who  has a past medical history of Back pain, Hypertension, and Neck pain.   63 year old with past medical history as above.  He developed COVID-19 in 2020 but did not require hospitalization.  Since then he has had mild intermittent wheezing.  Treated with albuterol  and Advair on and off.  Has history of recurrent bronchitis.  Went to a trip to [place] in June 2024 and then developed an exacerbation.  He was seen in the ED with a normal chest x-ray.  Given prednisone  and nebulizers.  Interim history: Discussed the use of AI scribe software for clinical note transcription with the patient, who gave verbal consent to proceed.  History of Present Illness Joel Ray is a 63 year old male with mild asthma who presents with wheezing.  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  No feather pillows or comforters Smoking history: Never smoker Travel history: Originally from [place]  Relevant family history: Mom had COPD, grand parents had lung cancer.  They are all heavy smokers.  Outpatient Encounter Medications as of 01/15/2024  Medication Sig   albuterol  (VENTOLIN  HFA)  108 (90 Base) MCG/ACT inhaler Inhale 1-2 puffs into the lungs every 6 (six) hours as needed for wheezing or shortness of breath.   budesonide -formoterol  (SYMBICORT ) 160-4.5 MCG/ACT inhaler Inhale 2 puffs into the lungs daily.   hydrochlorothiazide (HYDRODIURIL) 25 MG tablet Take 25 mg by mouth daily.   olmesartan (BENICAR) 40 MG tablet Take 40 mg by mouth daily.   chlorpheniramine-HYDROcodone (TUSSIONEX) 10-8 MG/5ML Take 5 mLs by mouth at bedtime as needed for cough. (Patient not taking: Reported on 01/15/2024)   No facility-administered encounter medications on file as of 01/15/2024.    Vitals:   01/15/24 1405  BP: 132/80  Pulse: 70  Temp: 98.2 F (36.8 C)  Height: 6' 3 (1.905 m)  Weight: 265 lb (120.2 kg)  SpO2: 96%  TempSrc: Oral  BMI (Calculated): 33.12    Physical Exam GEN: No acute distress CV: Regular rate and rhythm no murmurs LUNGS: Clear to auscultation bilaterally normal respiratory effort SKIN JOINTS: Warm and dry no rash   Data Reviewed: Imaging: Chest x-ray 09/27/22-no active cardiopulmonary disease I reviewed the images personally.  PFTs: 01/15/2024 FVC 4.54 [82%], FEV1 3.55 [81%], F/F74, TLC 7.12 [88%], DLCO 29.37 [74%] Normal test  FENO  12/28/2022-14 01/15/2024-13  Labs: CBC 12/28/2022- WBC 6.0, eos 3.7%, absolute eosinophil count 222 IgE 12/28/2022- 19  Assessment & Plan Asthma with intermittent wheezing Mild asthma with intermittent wheezing, primarily nocturnal when supine. Wheezing occurs on exhalation and is not daily. Normal lung function test and low FENO levels indicate no significant inflammation.  He  is not using Symbicort  on regular basis. - Prescribe Symbicort  for daily use: two puffs in the morning and two puffs in the evening. - Provide a prescription for a spacer to improve inhaler delivery and reduce coughing. - Advise to take the inhaler on upcoming trips, especially to [place], as cold weather can exacerbate asthma symptoms.    Plan/Recommendations: Symbicort  daily.  2 puffs twice daily Prescribe spacer  Lonna Coder MD [facility] 01/15/2024, 2:31 PM  CC: Clariza Sickman, MD

## 2024-01-15 NOTE — Patient Instructions (Addendum)
  VISIT SUMMARY: Today, we discussed your mild asthma and the intermittent wheezing you experience, especially at night when lying flat. We reviewed your current asthma management and made some adjustments to improve your symptoms.  YOUR PLAN: ASTHMA WITH INTERMITTENT WHEEZING: You have mild asthma with wheezing that occurs mainly at night when you lie flat. This wheezing happens on exhalation and is not present every day. -Start using Symbicort  daily: two puffs in the morning and two puffs in the evening. -A prescription for a spacer has been provided to help improve inhaler delivery and reduce coughing. -Remember to take your inhaler on your upcoming trips, especially to [place], as cold weather can make asthma symptoms worse.

## 2024-01-31 ENCOUNTER — Other Ambulatory Visit (HOSPITAL_BASED_OUTPATIENT_CLINIC_OR_DEPARTMENT_OTHER): Payer: Self-pay | Admitting: Family Medicine

## 2024-01-31 DIAGNOSIS — Z136 Encounter for screening for cardiovascular disorders: Secondary | ICD-10-CM

## 2024-02-13 ENCOUNTER — Ambulatory Visit (HOSPITAL_BASED_OUTPATIENT_CLINIC_OR_DEPARTMENT_OTHER)
Admission: RE | Admit: 2024-02-13 | Discharge: 2024-02-13 | Disposition: A | Discharge status: Home / Self Care | Payer: Self-pay | Source: Ambulatory Visit | Attending: Family Medicine | Admitting: Family Medicine

## 2024-02-13 DIAGNOSIS — Z136 Encounter for screening for cardiovascular disorders: Secondary | ICD-10-CM | POA: Insufficient documentation

## 2024-02-21 ENCOUNTER — Other Ambulatory Visit: Payer: Self-pay | Admitting: Pulmonary Disease

## 2024-02-21 NOTE — Telephone Encounter (Signed)
 Please advise,   Pharmacy comment: Alternative Requested:PA OR CHANGE MED.

## 2024-02-21 NOTE — Telephone Encounter (Signed)
 Please advise (Pharmacy comment: Alternative Requested:BRAND AND GENERIC NO LINGER ON FORMULARY PA OR CHANGE MED. )
